# Patient Record
Sex: Female | Born: 1965 | Race: White | Hispanic: No | Marital: Married | Smoking: Never smoker
Health system: Southern US, Community
[De-identification: ages and names within clinical notes are randomized; demographics above are authoritative.]

## PROBLEM LIST (undated history)

## (undated) DIAGNOSIS — Z975 Presence of (intrauterine) contraceptive device: Secondary | ICD-10-CM

## (undated) DIAGNOSIS — M1990 Unspecified osteoarthritis, unspecified site: Secondary | ICD-10-CM

## (undated) HISTORY — PX: COLONOSCOPY: SHX174

## (undated) HISTORY — PX: POLYPECTOMY: SHX149

## (undated) HISTORY — DX: Presence of (intrauterine) contraceptive device: Z97.5

## (undated) HISTORY — PX: CHEST TUBE INSERTION: SHX231

## (undated) HISTORY — DX: Unspecified osteoarthritis, unspecified site: M19.90

---

## 1999-05-09 ENCOUNTER — Encounter: Payer: Self-pay | Admitting: Gynecology

## 1999-05-09 ENCOUNTER — Encounter: Admission: RE | Admit: 1999-05-09 | Discharge: 1999-05-09 | Payer: Self-pay | Admitting: Gynecology

## 2000-06-01 ENCOUNTER — Other Ambulatory Visit: Admission: RE | Admit: 2000-06-01 | Discharge: 2000-06-01 | Payer: Self-pay | Admitting: Gynecology

## 2001-08-01 ENCOUNTER — Other Ambulatory Visit: Admission: RE | Admit: 2001-08-01 | Discharge: 2001-08-01 | Payer: Self-pay | Admitting: Gynecology

## 2002-08-03 ENCOUNTER — Other Ambulatory Visit: Admission: RE | Admit: 2002-08-03 | Discharge: 2002-08-03 | Payer: Self-pay | Admitting: Gynecology

## 2003-08-21 ENCOUNTER — Other Ambulatory Visit: Admission: RE | Admit: 2003-08-21 | Discharge: 2003-08-21 | Payer: Self-pay | Admitting: Gynecology

## 2004-08-22 ENCOUNTER — Other Ambulatory Visit: Admission: RE | Admit: 2004-08-22 | Discharge: 2004-08-22 | Payer: Self-pay | Admitting: Gynecology

## 2004-09-19 ENCOUNTER — Encounter: Admission: RE | Admit: 2004-09-19 | Discharge: 2004-09-19 | Payer: Self-pay | Admitting: Gynecology

## 2005-08-25 ENCOUNTER — Other Ambulatory Visit: Admission: RE | Admit: 2005-08-25 | Discharge: 2005-08-25 | Payer: Self-pay | Admitting: Gynecology

## 2005-11-25 ENCOUNTER — Encounter: Admission: RE | Admit: 2005-11-25 | Discharge: 2005-11-25 | Payer: Self-pay | Admitting: Gynecology

## 2006-09-13 ENCOUNTER — Other Ambulatory Visit: Admission: RE | Admit: 2006-09-13 | Discharge: 2006-09-13 | Payer: Self-pay | Admitting: Gynecology

## 2007-01-04 ENCOUNTER — Encounter: Admission: RE | Admit: 2007-01-04 | Discharge: 2007-01-04 | Payer: Self-pay | Admitting: Gynecology

## 2007-05-09 ENCOUNTER — Encounter: Admission: RE | Admit: 2007-05-09 | Discharge: 2007-05-09 | Payer: Self-pay | Admitting: Orthopedic Surgery

## 2007-05-24 ENCOUNTER — Encounter: Admission: RE | Admit: 2007-05-24 | Discharge: 2007-05-24 | Payer: Self-pay | Admitting: Orthopedic Surgery

## 2008-01-05 ENCOUNTER — Encounter: Admission: RE | Admit: 2008-01-05 | Discharge: 2008-01-05 | Payer: Self-pay | Admitting: Gynecology

## 2009-04-25 ENCOUNTER — Encounter: Admission: RE | Admit: 2009-04-25 | Discharge: 2009-04-25 | Payer: Self-pay | Admitting: Gynecology

## 2009-12-20 ENCOUNTER — Encounter (INDEPENDENT_AMBULATORY_CARE_PROVIDER_SITE_OTHER): Payer: Self-pay | Admitting: *Deleted

## 2010-01-01 ENCOUNTER — Ambulatory Visit: Payer: Self-pay | Admitting: Family Medicine

## 2010-01-01 DIAGNOSIS — H5310 Unspecified subjective visual disturbances: Secondary | ICD-10-CM | POA: Insufficient documentation

## 2010-01-01 DIAGNOSIS — J329 Chronic sinusitis, unspecified: Secondary | ICD-10-CM | POA: Insufficient documentation

## 2010-01-01 DIAGNOSIS — L851 Acquired keratosis [keratoderma] palmaris et plantaris: Secondary | ICD-10-CM | POA: Insufficient documentation

## 2010-01-13 ENCOUNTER — Encounter (INDEPENDENT_AMBULATORY_CARE_PROVIDER_SITE_OTHER): Payer: Self-pay | Admitting: *Deleted

## 2010-01-13 ENCOUNTER — Encounter: Payer: Self-pay | Admitting: Family Medicine

## 2010-01-13 LAB — CONVERTED CEMR LAB
Albumin: 4.3 g/dL
Alkaline Phosphatase: 57 units/L
Creatinine, Ser: 0.72 mg/dL
LDL Cholesterol: 123 mg/dL
MCH: 29.9 pg
MCV: 87.5 fL
Potassium, serum: 3.7 mmol/L
RBC count: 4.81 10*6/uL
Total Protein: 6.7 g/dL
Triglycerides: 102 mg/dL

## 2010-01-27 ENCOUNTER — Encounter (INDEPENDENT_AMBULATORY_CARE_PROVIDER_SITE_OTHER): Payer: Self-pay | Admitting: *Deleted

## 2010-04-29 ENCOUNTER — Encounter
Admission: RE | Admit: 2010-04-29 | Discharge: 2010-04-29 | Payer: Self-pay | Source: Home / Self Care | Attending: Gynecology | Admitting: Gynecology

## 2010-05-13 NOTE — Miscellaneous (Signed)
  Clinical Lists Changes  Observations: Added new observation of TSH: 1.204 microintl units/mL (01/13/2010 16:53) Added new observation of TRIGLYC TOT: 102 mg/dL (16/01/9603 54:09) Added new observation of LDL: 123 mg/dL (81/19/1478 29:56) Added new observation of HDL: 78 mg/dL (21/30/8657 84:69) Added new observation of CHOLESTEROL: 224 mg/dL (62/95/2841 32:44) Added new observation of BILI TOTAL: 1.3 mg/dL (04/15/7251 66:44) Added new observation of ALK PHOS: 57 units/L (01/13/2010 16:53) Added new observation of SGPT (ALT): 13 units/L (01/13/2010 16:53) Added new observation of SGOT (AST): 18 units/L (01/13/2010 16:53) Added new observation of PROTEIN, TOT: 6.7 g/dL (03/47/4259 56:38) Added new observation of ALBUMIN: 4.3 g/dL (75/64/3329 51:88) Added new observation of CALCIUM: 8.7 mg/dL (41/66/0630 16:01) Added new observation of GLUCOSE SER: 94 mg/dL (09/32/3557 32:20) Added new observation of CREATININE: 0.72 mg/dL (25/42/7062 37:62) Added new observation of BUN: 10 mg/dL (83/15/1761 60:73) Added new observation of CO2 TOTAL: 22 mmol/L (01/13/2010 16:53) Added new observation of CHLORIDE: 105 mmol/L (01/13/2010 16:53) Added new observation of POTASSIUM: 3.7 mmol/L (01/13/2010 16:53) Added new observation of SODIUM: 138 mmol/L (01/13/2010 16:53) Added new observation of PLATELETS: 285 10*3/mm3 (01/13/2010 16:53) Added new observation of MCH: 29.9 pg (01/13/2010 16:53) Added new observation of MCV: 87.5 fL (01/13/2010 16:53) Added new observation of HCT: 42.1 % (01/13/2010 16:53) Added new observation of HGB: 14.4 g/dL (71/09/2692 85:46) Added new observation of RBC: 4.81 10*6/mm3 (01/13/2010 16:53) Added new observation of WBC: 5.3 10*3/mm3 (01/13/2010 16:53)

## 2010-05-13 NOTE — Miscellaneous (Signed)
  Clinical Lists Changes  Observations: Added new observation of TD BOOSTER: Historical (10/20/2004 9:44)      Immunization History:  Tetanus/Td Immunization History:    Tetanus/Td:  historical (10/20/2004)

## 2010-05-13 NOTE — Letter (Signed)
Summary: Beather Arbour MD  Beather Arbour MD   Imported By: Lanelle Bal 02/04/2010 11:44:17  _____________________________________________________________________  External Attachment:    Type:   Image     Comment:   External Document
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
## 2011-03-27 ENCOUNTER — Telehealth: Payer: Self-pay | Admitting: *Deleted

## 2011-03-27 NOTE — Telephone Encounter (Signed)
Spoke with patient's husband and advised results. And to have pt call back in to schedule a CPE per has not been seen in over a year

## 2011-05-07 ENCOUNTER — Telehealth: Payer: Self-pay | Admitting: *Deleted

## 2011-05-07 NOTE — Telephone Encounter (Signed)
Noted pt has not been seen in over a year, pt husband was spoken to on 03-27-11 to have pt call in to schedule a CPE, per noted [facility] lab report, sent a letter to pt to advise she needs to schedule an upcoming CPE

## 2011-05-19 ENCOUNTER — Other Ambulatory Visit: Payer: Self-pay | Admitting: Gynecology

## 2011-05-19 DIAGNOSIS — Z1231 Encounter for screening mammogram for malignant neoplasm of breast: Secondary | ICD-10-CM

## 2011-05-27 ENCOUNTER — Ambulatory Visit
Admission: RE | Admit: 2011-05-27 | Discharge: 2011-05-27 | Disposition: A | Discharge status: Home / Self Care | Payer: BC Managed Care – PPO | Source: Ambulatory Visit | Attending: Gynecology | Admitting: Gynecology

## 2011-05-27 DIAGNOSIS — Z1231 Encounter for screening mammogram for malignant neoplasm of breast: Secondary | ICD-10-CM

## 2012-05-16 ENCOUNTER — Encounter: Payer: Self-pay | Admitting: Family Medicine

## 2012-05-16 ENCOUNTER — Ambulatory Visit (INDEPENDENT_AMBULATORY_CARE_PROVIDER_SITE_OTHER): Payer: BC Managed Care – PPO | Admitting: Family Medicine

## 2012-05-16 VITALS — BP 108/70 | HR 76 | Temp 98.0°F | Ht 67.5 in | Wt 167.4 lb

## 2012-05-16 DIAGNOSIS — E785 Hyperlipidemia, unspecified: Secondary | ICD-10-CM

## 2012-05-16 DIAGNOSIS — M79673 Pain in unspecified foot: Secondary | ICD-10-CM

## 2012-05-16 DIAGNOSIS — M79609 Pain in unspecified limb: Secondary | ICD-10-CM

## 2012-05-16 NOTE — Progress Notes (Signed)
  Subjective:    Patient ID: Briana Holden, female    DOB: [birth year], 47 y.o.   MRN: [id number]  HPI Hyperlipidemia- pt had labs done at GYN and was found to high elevated cholesterol.  Cholesterol from 11/12 showed total of 233 and LDL of 143.  No family hx of CAD, CVA.  Labs from 11/13 showed mild improvement in both LDL and total cholesterol.  Pt not interested in cholesterol med 'unless i have to'.  Admits to limited activity.  Foot pain- under balls of feet bilaterally.  Saw sports med and they fit her w/ metatarsal pads.  This made pain worse than before.  After 2 months of metatarsal pads, removed temporary orthotics.  Pain in L foot has dramatically improved and is starting to improve on R.  Wants to know what she can do or what are the next steps.   Review of Systems For ROS see HPI     Objective:   Physical Exam  Vitals reviewed. Constitutional: She is oriented to person, place, and time. She appears well-developed and well-nourished. No distress.  HENT:  Head: Normocephalic and atraumatic.  Eyes: Conjunctivae normal and EOM are normal. Pupils are equal, round, and reactive to light.  Neck: Normal range of motion. Neck supple. No thyromegaly present.  Cardiovascular: Normal rate, regular rhythm, normal heart sounds and intact distal pulses.   No murmur heard. Pulmonary/Chest: Effort normal and breath sounds normal. No respiratory distress.  Abdominal: Soft. She exhibits no distension. There is no tenderness.  Musculoskeletal: She exhibits no edema.  Lymphadenopathy:    She has no cervical adenopathy.  Neurological: She is alert and oriented to person, place, and time.  Skin: Skin is warm and dry.  Psychiatric: She has a normal mood and affect. Her behavior is normal.          Assessment & Plan:

## 2012-05-16 NOTE — Patient Instructions (Addendum)
Follow up in 6 months to recheck cholesterol Work on healthy diet and regular exercise You can add OTC Red Yeast Rice if so inclined to lower LDL Ice your foot by rolling it over the water bottle- goal is 1-2x/day Wear good, cushioned/supportive shoes If no improvement in 2 weeks- call and we'll get you set up w/ Podiatry Call with any questions or concerns Happy [birth year] Birthday!

## 2012-05-17 DIAGNOSIS — E785 Hyperlipidemia, unspecified: Secondary | ICD-10-CM | POA: Insufficient documentation

## 2012-05-17 DIAGNOSIS — M79673 Pain in unspecified foot: Secondary | ICD-10-CM | POA: Insufficient documentation

## 2012-05-17 NOTE — Assessment & Plan Note (Signed)
New.  Pt's LDL and total cholesterol are not at goal but not outrageously high.  Reviewed increased risk of MI, CVA.  Pt prefers to hold off on meds and work on healthy diet and regular exercise.  Will repeat labs in 6 months.

## 2012-05-17 NOTE — Assessment & Plan Note (Signed)
New.  Pt w/ dx of metatarsalgia.  Pain improving since removal of metatarsal pads.  Encouraged ice, stretching, and supportive shoes.  If no improvement, will refer to podiatry.  Pt expressed understanding and is in agreement w/ plan.

## 2012-06-10 ENCOUNTER — Telehealth: Payer: Self-pay | Admitting: Family Medicine

## 2012-06-10 DIAGNOSIS — M79673 Pain in unspecified foot: Secondary | ICD-10-CM

## 2012-06-10 NOTE — Telephone Encounter (Signed)
Ok to enter referral 

## 2012-06-10 NOTE — Telephone Encounter (Signed)
Patient states she is still having problems with her foot. She would like to pursue referral to podiatrist. She cannot go next week or on 3/10.

## 2012-06-12 NOTE — Telephone Encounter (Signed)
Ok for podiatry referral- dx foot pain

## 2012-06-14 NOTE — Telephone Encounter (Signed)
Referral for podiatry sent.//AB/CMA

## 2012-07-13 ENCOUNTER — Other Ambulatory Visit: Payer: Self-pay

## 2012-07-13 DIAGNOSIS — Z1231 Encounter for screening mammogram for malignant neoplasm of breast: Secondary | ICD-10-CM

## 2012-07-20 ENCOUNTER — Ambulatory Visit
Admission: RE | Admit: 2012-07-20 | Discharge: 2012-07-20 | Disposition: A | Discharge status: Home / Self Care | Payer: BC Managed Care – PPO | Source: Ambulatory Visit

## 2012-07-20 DIAGNOSIS — Z1231 Encounter for screening mammogram for malignant neoplasm of breast: Secondary | ICD-10-CM

## 2012-11-14 ENCOUNTER — Other Ambulatory Visit: Payer: BC Managed Care – PPO

## 2012-12-09 ENCOUNTER — Other Ambulatory Visit (INDEPENDENT_AMBULATORY_CARE_PROVIDER_SITE_OTHER): Payer: BC Managed Care – PPO

## 2012-12-09 DIAGNOSIS — E785 Hyperlipidemia, unspecified: Secondary | ICD-10-CM

## 2012-12-09 LAB — LIPID PANEL
Cholesterol: 219 mg/dL — ABNORMAL HIGH (ref 0–200)
Total CHOL/HDL Ratio: 3
Triglycerides: 79 mg/dL (ref 0.0–149.0)

## 2013-02-02 ENCOUNTER — Encounter: Payer: Self-pay | Admitting: Internal Medicine

## 2013-02-02 ENCOUNTER — Ambulatory Visit (INDEPENDENT_AMBULATORY_CARE_PROVIDER_SITE_OTHER): Payer: BC Managed Care – PPO | Admitting: Internal Medicine

## 2013-02-02 VITALS — BP 110/74 | HR 78 | Temp 98.8°F | Wt 171.0 lb

## 2013-02-02 DIAGNOSIS — N3090 Cystitis, unspecified without hematuria: Secondary | ICD-10-CM

## 2013-02-02 LAB — URINALYSIS, ROUTINE W REFLEX MICROSCOPIC
Ketones, ur: NEGATIVE
Specific Gravity, Urine: 1.02 (ref 1.000–1.030)
Total Protein, Urine: NEGATIVE
Urine Glucose: NEGATIVE
pH: 7 (ref 5.0–8.0)

## 2013-02-02 MED ORDER — CIPROFLOXACIN HCL 500 MG PO TABS: 500.0000 mg | ORAL_TABLET | Freq: Two times a day (BID) | ORAL | Status: DC

## 2013-02-02 MED ORDER — PHENAZOPYRIDINE HCL 200 MG PO TABS: 200.0000 mg | ORAL_TABLET | Freq: Three times a day (TID) | ORAL | Status: DC | PRN

## 2013-02-02 NOTE — Patient Instructions (Addendum)
cipro for 3 days Phenazopyridine as needed Drink plenty of fluids Call if not back to normal in few days, call anytime if severe symptoms

## 2013-02-02 NOTE — Progress Notes (Signed)
  Subjective:    Patient ID: Briana Holden, female    DOB: [birth year], 47 y.o.   MRN: [id number]  HPI Acute visit One-day history of urinary frequency with small amount of urine production, mild irritation in the bladder without dysuria per se. No recent UTIs. Patient drinks plenty of fluids.  No past medical history on file. Past Surgical History  Procedure Laterality Date  . Chest tube insertion      age 40   History   Social History  . Marital Status: Married    Spouse Name: N/A    Number of Children: 1  . Years of Education: N/A   Occupational History  . full time job    Social History Main Topics  . Smoking status: Never Smoker   . Smokeless tobacco: Never Used  . Alcohol Use: Yes     Comment: 1 drink per week  . Drug Use: No  . Sexual Activity: Not on file   Other Topics Concern  . Not on file   Social History Narrative  . No narrative on file    Review of Systems No fever chills, no flank pain. No vaginal discharge, itching or rash. Birth  Control-- IUD    Objective:   Physical Exam BP 110/74  Pulse 78  Temp(Src) 98.8 F (37.1 C)  Wt 171 lb (77.565 kg)  BMI 26.37 kg/m2  SpO2 97% General -- alert, well-developed, NAD.  Abdomen-- Not distended, good bowel sounds,soft, non-tender but felt some depression upon palpation of the lower abdomen. No rebound or rigidity. No CVA tenderness. Extremities-- no pretibial edema bilaterally  Neurologic--  alert & oriented X3. Speech normal, gait normal, strength normal in all extremities.  DTRs symmetric  Psych-- Cognition and judgment appear intact. Cooperative with normal attention span and concentration. No anxious appearing , no depressed appearing.      Assessment & Plan:  Symptoms consistent with cystitis, Send UA and urine culture, start empiric Cipro. See instructions. To use phenazopyridine if needed

## 2013-02-03 LAB — URINE CULTURE
Colony Count: NO GROWTH
Organism ID, Bacteria: NO GROWTH

## 2013-03-02 ENCOUNTER — Ambulatory Visit (INDEPENDENT_AMBULATORY_CARE_PROVIDER_SITE_OTHER): Payer: BC Managed Care – PPO

## 2013-03-02 ENCOUNTER — Encounter: Payer: Self-pay | Admitting: Podiatry

## 2013-03-02 ENCOUNTER — Ambulatory Visit (INDEPENDENT_AMBULATORY_CARE_PROVIDER_SITE_OTHER): Payer: BC Managed Care – PPO | Admitting: Podiatry

## 2013-03-02 VITALS — BP 111/82 | HR 80 | Resp 12 | Ht 68.0 in | Wt 160.0 lb

## 2013-03-02 DIAGNOSIS — R52 Pain, unspecified: Secondary | ICD-10-CM

## 2013-03-02 DIAGNOSIS — M779 Enthesopathy, unspecified: Secondary | ICD-10-CM

## 2013-03-02 NOTE — Progress Notes (Signed)
N- SORE L-B/L FOOT D- OVER A YEAR O-SLOWLY C-BETTER A-WALKING T-BETTER QUALITY TENNIS SHOES

## 2013-03-02 NOTE — Progress Notes (Signed)
Subjective:     Patient ID: Briana Holden, female   DOB: 02/21/1966, 47 y.o.   MRN: [id number]  Foot Pain   patient presents stating I've been having trouble with my forefeet both feet for the last 2 years since and overuse injury occurred. I have been to several doctors with  current relief of my symptoms. Patient has tried orthotics and padding without relief   Review of Systems  All other systems reviewed and are negative.       Objective:   Physical Exam  Nursing note and vitals reviewed. Constitutional: She is oriented to person, place, and time.  Cardiovascular: Intact distal pulses.   Musculoskeletal: Normal range of motion.  Neurological: She is oriented to person, place, and time.  Skin: Skin is warm.   patient is found to have mild discomfort underneath the second metatarsal phalangeal joint both feet with swelling present. She states as long as she wears her tennis shoes she's pretty good but if she tries to wear other shoes they flare up. 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 She is somewhat weary of this so I tried to educate her to the benefits but ultimately she has to make the choice to whether she wants to do this or not. Reappoint if she decides for injection treatment

## 2013-06-21 ENCOUNTER — Telehealth: Payer: Self-pay | Admitting: *Deleted

## 2013-06-21 NOTE — Telephone Encounter (Signed)
Symptoms:  Sinus pain and pressure, nasal congestion, sneezing, and runny eyes.    Has been taking Sudafed (since yesterday morning), Affin, and ibuprofen, and nasal irrigation with very minimal relief.    Per patient typically gets a sinus infection after a cold and has hx. recurrent sinusitis.      Please advise.

## 2013-06-21 NOTE — Telephone Encounter (Signed)
Patient called and stated that she is having symptoms of a sinus infection. She states she does not want to be seen and would prefer something be called in for her. Please advise.JG//CMA

## 2013-06-21 NOTE — Telephone Encounter (Signed)
Add OTC Claritin or Zyrtec, Nasacort/Flonase.  Nyquil or Dayquil as needed.  If no improvement will need appt.  No abx w/o appt

## 2013-06-21 NOTE — Telephone Encounter (Signed)
Patient was advised the same.  Patient stated understanding.  No further concerns voiced.

## 2013-06-22 ENCOUNTER — Encounter: Payer: Self-pay | Admitting: Family Medicine

## 2013-06-22 ENCOUNTER — Ambulatory Visit (INDEPENDENT_AMBULATORY_CARE_PROVIDER_SITE_OTHER): Payer: BC Managed Care – PPO | Admitting: Family Medicine

## 2013-06-22 VITALS — BP 104/76 | HR 87 | Temp 98.4°F | Resp 16 | Wt 169.2 lb

## 2013-06-22 DIAGNOSIS — J329 Chronic sinusitis, unspecified: Secondary | ICD-10-CM

## 2013-06-22 MED ORDER — AMOXICILLIN 875 MG PO TABS: 875.0000 mg | ORAL_TABLET | Freq: Two times a day (BID) | ORAL | Status: DC

## 2013-06-22 MED ORDER — SULFAMETHOXAZOLE-TMP DS 800-160 MG PO TABS: 1.0000 | ORAL_TABLET | Freq: Two times a day (BID) | ORAL | Status: DC

## 2013-06-22 NOTE — Patient Instructions (Signed)
Follow up as needed Bactrim twice daily- take w/ food Drink plenty of fluids REST! Start daily antihistamine to prevent congestion and subsequent infection Call with any questions or concerns Hang in there!

## 2013-06-22 NOTE — Progress Notes (Signed)
Pre visit review using our clinic review tool, if applicable. No additional management support is needed unless otherwise documented below in the visit note. 

## 2013-06-22 NOTE — Assessment & Plan Note (Signed)
Pt's sxs and PE consistent w/ infxn.  Start abx.  Pt claims Amox is ineffective.  Start Bactrim.  Reviewed supportive care and red flags that should prompt return.  Pt expressed understanding and is in agreement w/ plan.

## 2013-06-22 NOTE — Progress Notes (Signed)
   Subjective:    Patient ID: Briana Holden, female    DOB: Jan 25, 1966, 48 y.o.   MRN: [id number]  Sinusitis   URI- sxs initially started 2 weeks ago as 'low level sinus thing'.  Starting Monday developed increased congestion, pressure, swelling, sneezing.  No fevers.  + sick contacts.  No N/V.  + tooth pain.  Bilateral ear fullness.   Review of Systems For ROS see HPI     Objective:   Physical Exam  Vitals reviewed. Constitutional: She appears well-developed and well-nourished. No distress.  HENT:  Head: Normocephalic and atraumatic.  Right Ear: Tympanic membrane normal.  Left Ear: Tympanic membrane normal.  Nose: Mucosal edema and rhinorrhea present. Right sinus exhibits maxillary sinus tenderness and frontal sinus tenderness. Left sinus exhibits maxillary sinus tenderness and frontal sinus tenderness.  Mouth/Throat: Uvula is midline and mucous membranes are normal. Posterior oropharyngeal erythema present. No oropharyngeal exudate.  Eyes: Conjunctivae and EOM are normal. Pupils are equal, round, and reactive to light.  Neck: Normal range of motion. Neck supple.  Cardiovascular: Normal rate, regular rhythm and normal heart sounds.   Pulmonary/Chest: Effort normal and breath sounds normal. No respiratory distress. She has no wheezes.  Lymphadenopathy:    She has no cervical adenopathy.          Assessment & Plan:

## 2013-06-29 ENCOUNTER — Other Ambulatory Visit: Payer: Self-pay

## 2013-06-29 DIAGNOSIS — Z1231 Encounter for screening mammogram for malignant neoplasm of breast: Secondary | ICD-10-CM

## 2013-07-24 ENCOUNTER — Ambulatory Visit
Admission: RE | Admit: 2013-07-24 | Discharge: 2013-07-24 | Disposition: A | Discharge status: Home / Self Care | Payer: BC Managed Care – PPO | Source: Ambulatory Visit

## 2013-07-24 DIAGNOSIS — Z1231 Encounter for screening mammogram for malignant neoplasm of breast: Secondary | ICD-10-CM

## 2013-07-26 ENCOUNTER — Encounter: Payer: Self-pay | Admitting: General Practice

## 2013-07-26 ENCOUNTER — Other Ambulatory Visit (INDEPENDENT_AMBULATORY_CARE_PROVIDER_SITE_OTHER): Payer: BC Managed Care – PPO

## 2013-07-26 ENCOUNTER — Other Ambulatory Visit: Payer: Self-pay | Admitting: Family Medicine

## 2013-07-26 ENCOUNTER — Other Ambulatory Visit: Payer: BC Managed Care – PPO

## 2013-07-26 DIAGNOSIS — Z1211 Encounter for screening for malignant neoplasm of colon: Secondary | ICD-10-CM

## 2013-07-26 LAB — FECAL OCCULT BLOOD, IMMUNOCHEMICAL: FECAL OCCULT BLD: NEGATIVE

## 2013-07-28 ENCOUNTER — Encounter: Payer: BC Managed Care – PPO | Admitting: Family Medicine

## 2013-08-10 ENCOUNTER — Telehealth: Payer: Self-pay

## 2013-08-10 NOTE — Telephone Encounter (Signed)
Left message for call back Non-identifiable   Pap [facility]- 07/24/13-negative BD Flu Td- 10/20/04

## 2013-08-11 ENCOUNTER — Encounter: Payer: Self-pay | Admitting: Family Medicine

## 2013-08-11 ENCOUNTER — Ambulatory Visit (INDEPENDENT_AMBULATORY_CARE_PROVIDER_SITE_OTHER): Payer: BC Managed Care – PPO | Admitting: Family Medicine

## 2013-08-11 ENCOUNTER — Other Ambulatory Visit (HOSPITAL_COMMUNITY)
Admission: RE | Admit: 2013-08-11 | Discharge: 2013-08-11 | Disposition: A | Discharge status: Home / Self Care | Payer: BC Managed Care – PPO | Source: Ambulatory Visit | Attending: Family Medicine | Admitting: Family Medicine

## 2013-08-11 VITALS — BP 100/72 | HR 81 | Temp 98.2°F | Resp 16 | Ht 69.0 in | Wt 168.2 lb

## 2013-08-11 DIAGNOSIS — Z0000 Encounter for general adult medical examination without abnormal findings: Secondary | ICD-10-CM | POA: Insufficient documentation

## 2013-08-11 DIAGNOSIS — Z01419 Encounter for gynecological examination (general) (routine) without abnormal findings: Secondary | ICD-10-CM

## 2013-08-11 DIAGNOSIS — Z124 Encounter for screening for malignant neoplasm of cervix: Secondary | ICD-10-CM | POA: Insufficient documentation

## 2013-08-11 DIAGNOSIS — Z1151 Encounter for screening for human papillomavirus (HPV): Secondary | ICD-10-CM | POA: Insufficient documentation

## 2013-08-11 LAB — BASIC METABOLIC PANEL
BUN: 10 mg/dL (ref 6–23)
CALCIUM: 8.9 mg/dL (ref 8.4–10.5)
CO2: 28 mEq/L (ref 19–32)
CREATININE: 0.7 mg/dL (ref 0.4–1.2)
Chloride: 102 mEq/L (ref 96–112)
GFR: 101.06 mL/min (ref >60.00)
GLUCOSE: 91 mg/dL (ref 70–99)
POTASSIUM: 3.9 meq/L (ref 3.5–5.1)
Sodium: 137 mEq/L (ref 135–145)

## 2013-08-11 LAB — HEPATIC FUNCTION PANEL
ALBUMIN: 4.1 g/dL (ref 3.5–5.2)
ALK PHOS: 59 U/L (ref 39–117)
ALT: 16 U/L (ref 0–35)
AST: 19 U/L (ref 0–37)
Bilirubin, Direct: 0 mg/dL (ref 0.0–0.3)
TOTAL PROTEIN: 6.9 g/dL (ref 6.0–8.3)
Total Bilirubin: 1.1 mg/dL (ref 0.3–1.2)

## 2013-08-11 LAB — CBC WITH DIFFERENTIAL/PLATELET
BASOS ABS: 0 10*3/uL (ref 0.0–0.1)
Basophils Relative: 0.4 % (ref 0.0–3.0)
EOS PCT: 2.5 % (ref 0.0–5.0)
Eosinophils Absolute: 0.2 10*3/uL (ref 0.0–0.7)
HCT: 42.8 % (ref 36.0–46.0)
HEMOGLOBIN: 14.6 g/dL (ref 12.0–15.0)
LYMPHS PCT: 26.6 % (ref 12.0–46.0)
Lymphs Abs: 1.7 10*3/uL (ref 0.7–4.0)
MCHC: 34.1 g/dL (ref 30.0–36.0)
MCV: 89.7 fl (ref 78.0–100.0)
MONOS PCT: 9.6 % (ref 3.0–12.0)
Monocytes Absolute: 0.6 10*3/uL (ref 0.1–1.0)
NEUTROS ABS: 4 10*3/uL (ref 1.4–7.7)
Neutrophils Relative %: 60.9 % (ref 43.0–77.0)
PLATELETS: 281 10*3/uL (ref 150.0–400.0)
RBC: 4.77 Mil/uL (ref 3.87–5.11)
RDW: 13.2 % (ref 11.5–14.6)
WBC: 6.5 10*3/uL (ref 4.5–10.5)

## 2013-08-11 LAB — LIPID PANEL
CHOLESTEROL: 219 mg/dL — AB (ref 0–200)
HDL: 71.4 mg/dL (ref >39.00)
LDL CALC: 132 mg/dL — AB (ref 0–99)
TRIGLYCERIDES: 76 mg/dL (ref 0.0–149.0)
Total CHOL/HDL Ratio: 3
VLDL: 15.2 mg/dL (ref 0.0–40.0)

## 2013-08-11 LAB — TSH: TSH: 0.75 u[IU]/mL (ref 0.35–5.50)

## 2013-08-11 NOTE — Assessment & Plan Note (Signed)
Pt's PE WNL.  UTD on mammo.  Pap collected.  Check labs.  Anticipatory guidance provided.

## 2013-08-11 NOTE — Assessment & Plan Note (Signed)
Pap collected. 

## 2013-08-11 NOTE — Progress Notes (Signed)
Pre visit review using our clinic review tool, if applicable. No additional management support is needed unless otherwise documented below in the visit note. 

## 2013-08-11 NOTE — Patient Instructions (Signed)
Follow up in 1 year or as needed We'll notify you of your lab results and make any changes if needed Call with any questions or concerns Keep up the good work!  You look great! Happy Spring!

## 2013-08-11 NOTE — Progress Notes (Signed)
   Subjective:    Patient ID: Briana Holden, female    DOB: Aug 16, 1965, 48 y.o.   MRN: [id number]  HPI CPE- pt thinks pap was 2 yrs ago.  UTD on mammo.  IUD placed Jan 2013.  No concerns today.   Review of Systems Patient reports no vision/ hearing changes, adenopathy,fever, weight change,  persistant/recurrent hoarseness , swallowing issues, chest pain, palpitations, edema, persistant/recurrent cough, hemoptysis, dyspnea (rest/exertional/paroxysmal nocturnal), gastrointestinal bleeding (melena, rectal bleeding), abdominal pain, significant heartburn, bowel changes, GU symptoms (dysuria, hematuria, incontinence), Gyn symptoms (abnormal  bleeding, pain),  syncope, focal weakness, memory loss, numbness & tingling, skin/hair/nail changes, abnormal bruising or bleeding, anxiety, or depression.     Objective:   Physical Exam  General Appearance:    Alert, cooperative, no distress, appears stated age  Head:    Normocephalic, without obvious abnormality, atraumatic  Eyes:    PERRL, conjunctiva/corneas clear, EOM's intact, fundi    benign, both eyes  Ears:    Normal TM's and external ear canals, both ears  Nose:   Nares normal, septum midline, mucosa normal, no drainage    or sinus tenderness  Throat:   Lips, mucosa, and tongue normal; teeth and gums normal  Neck:   Supple, symmetrical, trachea midline, no adenopathy;    Thyroid: no enlargement/tenderness/nodules  Back:     Symmetric, no curvature, ROM normal, no CVA tenderness  Lungs:     Clear to auscultation bilaterally, respirations unlabored  Chest Wall:    No tenderness or deformity   Heart:    Regular rate and rhythm, S1 and S2 normal, no murmur, rub   or gallop  Breast Exam:    No tenderness, masses, or nipple abnormality  Abdomen:     Soft, non-tender, bowel sounds active all four quadrants,    no masses, no organomegaly  Genitalia:    External genitalia normal, cervix normal in appearance, no CMT, uterus in normal size and  position, adnexa w/out mass or tenderness, mucosa pink and moist, no lesions or discharge present  Rectal:    Normal external appearance  Extremities:   Extremities normal, atraumatic, no cyanosis or edema  Pulses:   2+ and symmetric all extremities  Skin:   Skin color, texture, turgor normal, no rashes or lesions  Lymph nodes:   Cervical, supraclavicular, and axillary nodes normal  Neurologic:   CNII-XII intact, normal strength, sensation and reflexes    throughout          Assessment & Plan:

## 2013-08-14 NOTE — Telephone Encounter (Signed)
Unable to reach pre visit.  

## 2013-08-16 ENCOUNTER — Encounter: Payer: Self-pay | Admitting: General Practice

## 2013-08-16 LAB — VITAMIN D 1,25 DIHYDROXY
VITAMIN D 1, 25 (OH) TOTAL: 52 pg/mL (ref 18–72)
Vitamin D3 1, 25 (OH)2: 52 pg/mL

## 2014-06-19 ENCOUNTER — Other Ambulatory Visit: Payer: Self-pay

## 2014-06-19 DIAGNOSIS — Z1231 Encounter for screening mammogram for malignant neoplasm of breast: Secondary | ICD-10-CM

## 2014-07-26 ENCOUNTER — Ambulatory Visit
Admission: RE | Admit: 2014-07-26 | Discharge: 2014-07-26 | Disposition: A | Discharge status: Home / Self Care | Payer: BLUE CROSS/BLUE SHIELD | Source: Ambulatory Visit

## 2014-07-26 DIAGNOSIS — Z1231 Encounter for screening mammogram for malignant neoplasm of breast: Secondary | ICD-10-CM

## 2014-07-27 ENCOUNTER — Telehealth: Payer: Self-pay | Admitting: Family Medicine

## 2014-07-27 NOTE — Telephone Encounter (Signed)
Pre Visit letter sent  °

## 2014-08-14 ENCOUNTER — Encounter: Payer: Self-pay | Admitting: *Deleted

## 2014-08-14 ENCOUNTER — Telehealth: Payer: Self-pay | Admitting: *Deleted

## 2014-08-14 NOTE — Telephone Encounter (Signed)
Pre-Visit Call completed with patient and chart updated.   Pre-Visit Info documented in Specialty Comments under [employer].    

## 2014-08-15 ENCOUNTER — Other Ambulatory Visit: Payer: Self-pay | Admitting: General Practice

## 2014-08-15 ENCOUNTER — Ambulatory Visit (INDEPENDENT_AMBULATORY_CARE_PROVIDER_SITE_OTHER): Payer: BLUE CROSS/BLUE SHIELD | Admitting: Family Medicine

## 2014-08-15 ENCOUNTER — Encounter: Payer: Self-pay | Admitting: Family Medicine

## 2014-08-15 VITALS — BP 102/70 | HR 74 | Temp 98.3°F | Resp 16 | Ht 69.0 in | Wt 168.1 lb

## 2014-08-15 DIAGNOSIS — Z0000 Encounter for general adult medical examination without abnormal findings: Secondary | ICD-10-CM

## 2014-08-15 DIAGNOSIS — Z23 Encounter for immunization: Secondary | ICD-10-CM | POA: Diagnosis not present

## 2014-08-15 DIAGNOSIS — Z1211 Encounter for screening for malignant neoplasm of colon: Secondary | ICD-10-CM

## 2014-08-15 DIAGNOSIS — M7989 Other specified soft tissue disorders: Secondary | ICD-10-CM

## 2014-08-15 LAB — CBC WITH DIFFERENTIAL/PLATELET
BASOS ABS: 0 10*3/uL (ref 0.0–0.1)
BASOS PCT: 0.5 % (ref 0.0–3.0)
Eosinophils Absolute: 0.1 10*3/uL (ref 0.0–0.7)
Eosinophils Relative: 2.6 % (ref 0.0–5.0)
HCT: 41.3 % (ref 36.0–46.0)
HEMOGLOBIN: 14.1 g/dL (ref 12.0–15.0)
LYMPHS ABS: 1.4 10*3/uL (ref 0.7–4.0)
Lymphocytes Relative: 28.9 % (ref 12.0–46.0)
MCHC: 34.2 g/dL (ref 30.0–36.0)
MCV: 87.7 fl (ref 78.0–100.0)
MONOS PCT: 9.1 % (ref 3.0–12.0)
Monocytes Absolute: 0.5 10*3/uL (ref 0.1–1.0)
NEUTROS ABS: 3 10*3/uL (ref 1.4–7.7)
Neutrophils Relative %: 58.9 % (ref 43.0–77.0)
Platelets: 260 10*3/uL (ref 150.0–400.0)
RBC: 4.71 Mil/uL (ref 3.87–5.11)
RDW: 13.4 % (ref 11.5–15.5)
WBC: 5 10*3/uL (ref 4.0–10.5)

## 2014-08-15 LAB — BASIC METABOLIC PANEL
BUN: 12 mg/dL (ref 6–23)
CO2: 29 mEq/L (ref 19–32)
Calcium: 9.1 mg/dL (ref 8.4–10.5)
Chloride: 106 mEq/L (ref 96–112)
Creatinine, Ser: 0.71 mg/dL (ref 0.40–1.20)
GFR: 92.51 mL/min (ref >60.00)
GLUCOSE: 100 mg/dL — AB (ref 70–99)
POTASSIUM: 3.8 meq/L (ref 3.5–5.1)
Sodium: 140 mEq/L (ref 135–145)

## 2014-08-15 LAB — LIPID PANEL
CHOL/HDL RATIO: 3
Cholesterol: 203 mg/dL — ABNORMAL HIGH (ref 0–200)
HDL: 59.4 mg/dL (ref >39.00)
LDL Cholesterol: 131 mg/dL — ABNORMAL HIGH (ref 0–99)
NonHDL: 143.6
TRIGLYCERIDES: 64 mg/dL (ref 0.0–149.0)
VLDL: 12.8 mg/dL (ref 0.0–40.0)

## 2014-08-15 LAB — HEPATIC FUNCTION PANEL
ALBUMIN: 4 g/dL (ref 3.5–5.2)
ALT: 12 U/L (ref 0–35)
AST: 14 U/L (ref 0–37)
Alkaline Phosphatase: 56 U/L (ref 39–117)
Bilirubin, Direct: 0.1 mg/dL (ref 0.0–0.3)
Total Bilirubin: 1 mg/dL (ref 0.2–1.2)
Total Protein: 7 g/dL (ref 6.0–8.3)

## 2014-08-15 LAB — VITAMIN D 25 HYDROXY (VIT D DEFICIENCY, FRACTURES): VITD: 20.16 ng/mL — AB (ref 30.00–100.00)

## 2014-08-15 LAB — TSH: TSH: 0.84 u[IU]/mL (ref 0.35–4.50)

## 2014-08-15 MED ORDER — VITAMIN D (ERGOCALCIFEROL) 1.25 MG (50000 UNIT) PO CAPS: 50000.0000 [IU] | ORAL_CAPSULE | ORAL | Status: DC

## 2014-08-15 NOTE — Assessment & Plan Note (Signed)
New.  Bottom of L foot.  Consistent w/ either ganglion or morton's neuroma.  Nontender.  Pt is not interested in imaging at this time.  Reviewed parameters that would warrant further work up- growth, change, pain.  Pt expressed understanding and is in agreement w/ plan.

## 2014-08-15 NOTE — Assessment & Plan Note (Signed)
Pt's PE WNL.  UTD on pap, mammo.  Due for colonoscopy since turning 49- referral entered.  Check labs.  Anticipatory guidance provided.

## 2014-08-15 NOTE — Progress Notes (Signed)
Pre visit review using our clinic review tool, if applicable. No additional management support is needed unless otherwise documented below in the visit note. 

## 2014-08-15 NOTE — Patient Instructions (Signed)
Follow up 1 year or as needed We'll notify you of your lab results and make any changes if needed We'll call you with your GI appt Call with any questions or concerns Keep up the good work!  You look great! If the area on your foot changes in size, shape, or pain- let me know! Happy Mother's Day!!!

## 2014-08-15 NOTE — Progress Notes (Signed)
   Subjective:    Patient ID: Briana Holden, female    DOB: 1965/04/20, 49 y.o.   MRN: [id number]  HPI CPE- UTD on mammo.  Due for colonoscopy since turning 49.  UTD on pap.  Soft tissue mass bottom of L foot- appeared ~1 yr ago, has not changed in size.  Not painful.   Review of Systems Patient reports no vision/ hearing changes, adenopathy,fever, weight change,  persistant/recurrent hoarseness , swallowing issues, chest pain, palpitations, edema, persistant/recurrent cough, hemoptysis, dyspnea (rest/exertional/paroxysmal nocturnal), gastrointestinal bleeding (melena, rectal bleeding), abdominal pain, significant heartburn, bowel changes, GU symptoms (dysuria, hematuria, incontinence), Gyn symptoms (abnormal  bleeding, pain),  syncope, focal weakness, memory loss, numbness & tingling, skin/hair/nail changes, abnormal bruising or bleeding, anxiety, or depression.     Objective:   Physical Exam General Appearance:    Alert, cooperative, no distress, appears stated age  Head:    Normocephalic, without obvious abnormality, atraumatic  Eyes:    PERRL, conjunctiva/corneas clear, EOM's intact, fundi    benign, both eyes  Ears:    Normal TM's and external ear canals, both ears  Nose:   Nares normal, septum midline, mucosa normal, no drainage    or sinus tenderness  Throat:   Lips, mucosa, and tongue normal; teeth and gums normal  Neck:   Supple, symmetrical, trachea midline, no adenopathy;    Thyroid: no enlargement/tenderness/nodules  Back:     Symmetric, no curvature, ROM normal, no CVA tenderness  Lungs:     Clear to auscultation bilaterally, respirations unlabored  Chest Wall:    No tenderness or deformity   Heart:    Regular rate and rhythm, S1 and S2 normal, no murmur, rub   or gallop  Breast Exam:    Deferred to mammo  Abdomen:     Soft, non-tender, bowel sounds active all four quadrants,    no masses, no organomegaly  Genitalia:    Deferred  Rectal:    Extremities:   Extremities  normal, atraumatic, no cyanosis or edema.  Soft tissue mass on bottom of L foot- not painful, freely mobile.  Pulses:   2+ and symmetric all extremities  Skin:   Skin color, texture, turgor normal, no rashes or lesions  Lymph nodes:   Cervical, supraclavicular, and axillary nodes normal  Neurologic:   CNII-XII intact, normal strength, sensation and reflexes    throughout          Assessment & Plan:

## 2014-11-07 ENCOUNTER — Encounter: Payer: Self-pay | Admitting: Gastroenterology

## 2014-12-01 ENCOUNTER — Other Ambulatory Visit: Payer: Self-pay | Admitting: Family Medicine

## 2014-12-03 NOTE — Telephone Encounter (Signed)
Med denied, pt should continue OTC 2000iu vitamin d daily.

## 2014-12-27 ENCOUNTER — Ambulatory Visit (AMBULATORY_SURGERY_CENTER): Payer: Self-pay

## 2014-12-27 VITALS — Ht 67.75 in | Wt 170.0 lb

## 2014-12-27 DIAGNOSIS — Z1211 Encounter for screening for malignant neoplasm of colon: Secondary | ICD-10-CM

## 2014-12-27 MED ORDER — SUPREP BOWEL PREP KIT 17.5-3.13-1.6 GM/177ML PO SOLN: 1.0000 | Freq: Once | ORAL | Status: DC

## 2014-12-27 NOTE — Progress Notes (Signed)
No allergies to eggs or soy No diet/weight loss meds No home oxygen No past problems with anesthesia  Has email  Emmi instructions given for colonoscopy 

## 2015-01-03 ENCOUNTER — Telehealth: Payer: Self-pay | Admitting: Gastroenterology

## 2015-01-03 NOTE — Telephone Encounter (Signed)
Left vm for pt to callback 

## 2015-01-04 NOTE — Telephone Encounter (Signed)
Called pt and left vm for pt to call me back. I am trying to give pt a sample of suprep.

## 2015-01-04 NOTE — Telephone Encounter (Signed)
Pt called and I informed her that I will leave a sample at the front desk for her to pick up. She understands and states she will be in next week.

## 2015-01-10 ENCOUNTER — Encounter: Payer: Self-pay | Admitting: Gastroenterology

## 2015-01-10 ENCOUNTER — Ambulatory Visit (AMBULATORY_SURGERY_CENTER): Payer: BLUE CROSS/BLUE SHIELD | Admitting: Gastroenterology

## 2015-01-10 VITALS — BP 117/58 | HR 66 | Temp 97.3°F | Resp 16 | Ht 67.0 in | Wt 170.0 lb

## 2015-01-10 DIAGNOSIS — D120 Benign neoplasm of cecum: Secondary | ICD-10-CM

## 2015-01-10 DIAGNOSIS — D122 Benign neoplasm of ascending colon: Secondary | ICD-10-CM | POA: Diagnosis not present

## 2015-01-10 DIAGNOSIS — Z1211 Encounter for screening for malignant neoplasm of colon: Secondary | ICD-10-CM

## 2015-01-10 DIAGNOSIS — D128 Benign neoplasm of rectum: Secondary | ICD-10-CM

## 2015-01-10 DIAGNOSIS — K621 Rectal polyp: Secondary | ICD-10-CM

## 2015-01-10 MED ORDER — SODIUM CHLORIDE 0.9 % IV SOLN: 500.0000 mL | INTRAVENOUS | Status: DC

## 2015-01-10 NOTE — Op Note (Signed)
[place]  [address]. [place], [address]   COLONOSCOPY PROCEDURE REPORT  PATIENT: Briana Holden, Briana Holden  MR#: [id number] BIRTHDATE: 18-Aug-1965 , 49  yrs. old GENDER: female ENDOSCOPIST: Fox Chase Cellar, MD REFERRED BY: Annye Asa MD PROCEDURE DATE:  01/10/2015 PROCEDURE:   Colonoscopy with snare polypectomy First Screening Colonoscopy - Avg.  risk and is 50 yrs.  old or older Yes.  Prior Negative Screening - Now for repeat screening. N/A  History of Adenoma - Now for follow-up colonoscopy & has been > or = to 3 yrs.  N/A  Polyps removed today? Yes ASA CLASS:   Class II INDICATIONS:Screening for colonic neoplasia and Colorectal Neoplasm Risk Assessment for this procedure is average risk. MEDICATIONS: Propofol 400 mg IV  DESCRIPTION OF PROCEDURE:   After the risks benefits and alternatives of the procedure were thoroughly explained, informed consent was obtained.  The digital rectal exam revealed no abnormalities of the rectum.   The LB [id number]  endoscope was introduced through the anus and advanced to the cecum, which was identified by both the appendix and ileocecal valve. No adverse events experienced.   The quality of the prep was good.  The instrument was then slowly withdrawn as the colon was fully examined. Estimated blood loss is zero unless otherwise noted in this procedure report.    COLON FINDINGS: A flat polyp measuring 7 mm in size was found at the cecum.  A polypectomy was performed with a cold snare.  The resection was complete, the polyp tissue was completely retrieved and sent to histology.   Two sessile polyps measuring 5 mm in size were found in the ascending colon.  Polypectomies were performed with a cold snare.  The resection was complete, the polyp tissue was completely retrieved and sent to histology.   A semi-pedunculated polyp measuring 6 mm in size was found in the rectum.  A polypectomy was performed using snare  cautery.  The resection was complete, the polyp tissue was completely retrieved and sent to histology.   The examination was otherwise normal. Retroflexed views revealed internal hemorrhoids. The time to cecum = 3.5 Withdrawal time = 25.6   The scope was withdrawn and the procedure completed.  COMPLICATIONS: There were no immediate complications.   ENDOSCOPIC IMPRESSION: 1.   Flat polyp was found at the cecum; polypectomy was performed with a cold snare 2.   Two sessile polyps were found in the ascending colon; polypectomies were performed with a cold snare 3.   Semi-pedunculated polyp was found in the rectum; polypectomy was performed using snare cautery 4.   The examination was otherwise normal  RECOMMENDATIONS: 1.  Hold Aspirin and all other NSAIDS for 2 weeks. 2.  Await pathology results 3.  Resume diet 4.  Resume medications  eSigned:  Dana Cellar, MD 01/10/2015 8:37 AM   cc: Annye Asa MD, the patient   PATIENT NAME:  Briana Holden, Briana Holden MR#: [id number]

## 2015-01-10 NOTE — Progress Notes (Signed)
Report to PACU, RN, vss, BBS= Clear.  

## 2015-01-10 NOTE — Patient Instructions (Signed)
HOLD ASPIRIN, ASPIRIN PRODUCTS AND NSAIDS ( MOTRIN, ADVIL,IBUPROFEN, ALEVE, ETC ) FOR TWO WEEKS, 01/24/15.    YOU HAD AN ENDOSCOPIC PROCEDURE TODAY AT [facility]:   Refer to the procedure report that was given to you for any specific questions about what was found during the examination.  If the procedure report does not answer your questions, please call your gastroenterologist to clarify.  If you requested that your care partner not be given the details of your procedure findings, then the procedure report has been included in a sealed envelope for you to review at your convenience later.  YOU SHOULD EXPECT: Some feelings of bloating in the abdomen. Passage of more gas than usual.  Walking can help get rid of the air that was put into your GI tract during the procedure and reduce the bloating. If you had a lower endoscopy (such as a colonoscopy or flexible sigmoidoscopy) you may notice spotting of blood in your stool or on the toilet paper. If you underwent a bowel prep for your procedure, you may not have a normal bowel movement for a few days.  Please Note:  You might notice some irritation and congestion in your nose or some drainage.  This is from the oxygen used during your procedure.  There is no need for concern and it should clear up in a day or so.  SYMPTOMS TO REPORT IMMEDIATELY:   Following lower endoscopy (colonoscopy or flexible sigmoidoscopy):  Excessive amounts of blood in the stool  Significant tenderness or worsening of abdominal pains  Swelling of the abdomen that is new, acute  Fever of 100F or higher  For urgent or emergent issues, a gastroenterologist can be reached at any hour by calling [phone].   DIET: Your first meal following the procedure should be a small meal and then it is ok to progress to your normal diet. Heavy or fried foods are harder to digest and may make you feel nauseous or bloated.  Likewise, meals heavy in dairy and vegetables  can increase bloating.  Drink plenty of fluids but you should avoid alcoholic beverages for 24 hours.  ACTIVITY:  You should plan to take it easy for the rest of today and you should NOT DRIVE or use heavy machinery until tomorrow (because of the sedation medicines used during the test).    FOLLOW UP: Our staff will call the number listed on your records the next business day following your procedure to check on you and address any questions or concerns that you may have regarding the information given to you following your procedure. If we do not reach you, we will leave a message.  However, if you are feeling well and you are not experiencing any problems, there is no need to return our call.  We will assume that you have returned to your regular daily activities without incident.  If any biopsies were taken you will be contacted by phone or by letter within the next 1-3 weeks.  Please call us at [phone] if you have not heard about the biopsies in 3 weeks.    SIGNATURES/CONFIDENTIALITY: You and/or your care partner have signed paperwork which will be entered into your electronic medical record.  These signatures attest to the fact that that the information above on your After Visit Summary has been reviewed and is understood.  Full responsibility of the confidentiality of this discharge information lies with you and/or your care-partner.

## 2015-01-10 NOTE — Progress Notes (Signed)
Called to room to assist during endoscopic procedure.  Patient ID and intended procedure confirmed with present staff. Received instructions for my participation in the procedure from the performing physician.  

## 2015-01-11 ENCOUNTER — Telehealth: Payer: Self-pay | Admitting: *Deleted

## 2015-01-11 NOTE — Telephone Encounter (Signed)
  Follow up Call-  Call back number 01/10/2015  Post procedure Call Back phone  # [phone]  Permission to leave phone message Yes     Patient questions:  Do you have a fever, pain , or abdominal swelling? No. Pain Score  0 *  Have you tolerated food without any problems? Yes.    Have you been able to return to your normal activities? Yes.    Do you have any questions about your discharge instructions: Diet   No. Medications  No. Follow up visit  No.  Do you have questions or concerns about your Care? No.  Actions: * If pain score is 4 or above: No action needed, pain <4.

## 2015-01-16 ENCOUNTER — Encounter: Payer: Self-pay | Admitting: Gastroenterology

## 2015-02-21 ENCOUNTER — Telehealth: Payer: Self-pay | Admitting: Family Medicine

## 2015-02-21 DIAGNOSIS — Z30431 Encounter for routine checking of intrauterine contraceptive device: Secondary | ICD-10-CM

## 2015-02-21 NOTE — Telephone Encounter (Signed)
Relation to [id number] Call back nu mber:[phone]   Reason for call:  Patient GYN closed down and patient states records were transferred over and would like to know when IUD was inserted, please advise

## 2015-02-21 NOTE — Telephone Encounter (Signed)
Called pt and informed that we do not have complete records on the pt. Pt was advised that IUD was placed after 02/2011.

## 2015-02-21 NOTE — Telephone Encounter (Signed)
Pt was offered an appt with GYN to check to make sure IUD was still intact. Pt advised that she wanted to find her records first. Pt was given the number for [place] office to talk to records department.

## 2015-03-19 NOTE — Telephone Encounter (Signed)
Pt called in to check the status of her medical records receipt.    Pt CB: [phone]

## 2015-03-20 NOTE — Telephone Encounter (Signed)
Records have not been received, if they have, I have not seen them. JG//CMA

## 2015-03-22 NOTE — Telephone Encounter (Signed)
As per [facility] Records sent over records Monday, patient would like to know the status

## 2015-03-25 NOTE — Telephone Encounter (Signed)
Records received forwarded to [facility]. Patient did state she would like a follow up call once PCP reviewed IUD records.

## 2015-03-25 NOTE — Telephone Encounter (Signed)
Talked to medical records and they will be faxing them over to [phone]

## 2015-03-25 NOTE — Addendum Note (Signed)
Addended by: Davis Gourd on: 03/25/2015 04:37 PM   Modules accepted: Orders

## 2015-03-25 NOTE — Telephone Encounter (Signed)
Spoke with pt and advised that Dr. Birdie Riddle reviewed her records and advised that referral to GYN was needed. [facility] to [facility] placed today. Records copied one set sent to scanning and another given to [facility] to send to GYN.

## 2015-03-25 NOTE — Telephone Encounter (Signed)
Please notify patient when records are received

## 2015-03-26 NOTE — Telephone Encounter (Signed)
Documents completed with MRN & PCP and sent to scanning/SLS

## 2015-07-29 ENCOUNTER — Other Ambulatory Visit: Payer: Self-pay

## 2015-07-29 DIAGNOSIS — Z1231 Encounter for screening mammogram for malignant neoplasm of breast: Secondary | ICD-10-CM

## 2015-08-19 ENCOUNTER — Ambulatory Visit (INDEPENDENT_AMBULATORY_CARE_PROVIDER_SITE_OTHER): Payer: BLUE CROSS/BLUE SHIELD | Admitting: Family Medicine

## 2015-08-19 ENCOUNTER — Encounter: Payer: Self-pay | Admitting: Family Medicine

## 2015-08-19 VITALS — BP 120/68 | HR 66 | Temp 98.1°F | Resp 16 | Ht 67.0 in | Wt 165.1 lb

## 2015-08-19 DIAGNOSIS — Z0000 Encounter for general adult medical examination without abnormal findings: Secondary | ICD-10-CM

## 2015-08-19 LAB — TSH: TSH: 0.94 u[IU]/mL (ref 0.35–4.50)

## 2015-08-19 LAB — BASIC METABOLIC PANEL
BUN: 14 mg/dL (ref 6–23)
CALCIUM: 9.3 mg/dL (ref 8.4–10.5)
CHLORIDE: 104 meq/L (ref 96–112)
CO2: 29 mEq/L (ref 19–32)
CREATININE: 0.67 mg/dL (ref 0.40–1.20)
GFR: 98.52 mL/min (ref >60.00)
Glucose, Bld: 86 mg/dL (ref 70–99)
Potassium: 4.3 mEq/L (ref 3.5–5.1)
Sodium: 139 mEq/L (ref 135–145)

## 2015-08-19 LAB — HEPATIC FUNCTION PANEL
ALBUMIN: 4.4 g/dL (ref 3.5–5.2)
ALT: 14 U/L (ref 0–35)
AST: 16 U/L (ref 0–37)
Alkaline Phosphatase: 59 U/L (ref 39–117)
Bilirubin, Direct: 0.2 mg/dL (ref 0.0–0.3)
TOTAL PROTEIN: 7.1 g/dL (ref 6.0–8.3)
Total Bilirubin: 1 mg/dL (ref 0.2–1.2)

## 2015-08-19 LAB — LIPID PANEL
CHOL/HDL RATIO: 3
Cholesterol: 224 mg/dL — ABNORMAL HIGH (ref 0–200)
HDL: 71.4 mg/dL (ref >39.00)
LDL CALC: 142 mg/dL — AB (ref 0–99)
NonHDL: 152.27
TRIGLYCERIDES: 49 mg/dL (ref 0.0–149.0)
VLDL: 9.8 mg/dL (ref 0.0–40.0)

## 2015-08-19 LAB — CBC WITH DIFFERENTIAL/PLATELET
BASOS ABS: 0 10*3/uL (ref 0.0–0.1)
Basophils Relative: 0.6 % (ref 0.0–3.0)
Eosinophils Absolute: 0.1 10*3/uL (ref 0.0–0.7)
Eosinophils Relative: 2.4 % (ref 0.0–5.0)
HEMATOCRIT: 41.6 % (ref 36.0–46.0)
Hemoglobin: 14.1 g/dL (ref 12.0–15.0)
LYMPHS PCT: 31.5 % (ref 12.0–46.0)
Lymphs Abs: 1.6 10*3/uL (ref 0.7–4.0)
MCHC: 33.8 g/dL (ref 30.0–36.0)
MCV: 89.2 fl (ref 78.0–100.0)
MONOS PCT: 9.7 % (ref 3.0–12.0)
Monocytes Absolute: 0.5 10*3/uL (ref 0.1–1.0)
Neutro Abs: 2.8 10*3/uL (ref 1.4–7.7)
Neutrophils Relative %: 55.8 % (ref 43.0–77.0)
Platelets: 272 10*3/uL (ref 150.0–400.0)
RBC: 4.67 Mil/uL (ref 3.87–5.11)
RDW: 13.6 % (ref 11.5–15.5)
WBC: 5 10*3/uL (ref 4.0–10.5)

## 2015-08-19 LAB — VITAMIN D 25 HYDROXY (VIT D DEFICIENCY, FRACTURES): VITD: 26.15 ng/mL — ABNORMAL LOW (ref 30.00–100.00)

## 2015-08-19 NOTE — Progress Notes (Signed)
   Subjective:    Patient ID: Briana Holden, female    DOB: Jul 13, 1965, 50 y.o.   MRN: [id number]  HPI CPE- UTD on colonoscopy (due 2019), has mammo scheduled for tomorrow.  UTD on pap smear (due next year).     Review of Systems Patient reports no vision/ hearing changes, adenopathy,fever, weight change,  persistant/recurrent hoarseness , swallowing issues, chest pain, palpitations, edema, persistant/recurrent cough, hemoptysis, dyspnea (rest/exertional/paroxysmal nocturnal), gastrointestinal bleeding (melena, rectal bleeding), abdominal pain, significant heartburn, bowel changes, GU symptoms (dysuria, hematuria, incontinence), Gyn symptoms (abnormal  bleeding, pain),  syncope, focal weakness, memory loss, numbness & tingling, skin/hair/nail changes, abnormal bruising or bleeding, anxiety, or depression.     Objective:   Physical Exam General Appearance:    Alert, cooperative, no distress, appears stated age  Head:    Normocephalic, without obvious abnormality, atraumatic  Eyes:    PERRL, conjunctiva/corneas clear, EOM's intact, fundi    benign, both eyes  Ears:    Normal TM's and external ear canals, both ears  Nose:   Nares normal, septum midline, mucosa normal, no drainage    or sinus tenderness  Throat:   Lips, mucosa, and tongue normal; teeth and gums normal  Neck:   Supple, symmetrical, trachea midline, no adenopathy;    Thyroid: no enlargement/tenderness/nodules  Back:     Symmetric, no curvature, ROM normal, no CVA tenderness  Lungs:     Clear to auscultation bilaterally, respirations unlabored  Chest Wall:    No tenderness or deformity   Heart:    Regular rate and rhythm, S1 and S2 normal, no murmur, rub   or gallop  Breast Exam:    Deferred to mammo  Abdomen:     Soft, non-tender, bowel sounds active all four quadrants,    no masses, no organomegaly  Genitalia:    Deferred  Rectal:    Extremities:   Extremities normal, atraumatic, no cyanosis or edema  Pulses:   2+ and  symmetric all extremities  Skin:   Skin color, texture, turgor normal, no rashes or lesions  Lymph nodes:   Cervical, supraclavicular, and axillary nodes normal  Neurologic:   CNII-XII intact, normal strength, sensation and reflexes    throughout          Assessment & Plan:

## 2015-08-19 NOTE — Patient Instructions (Signed)
Follow up in 1 year or as needed We'll notify you of your lab results and make any changes if needed Keep up the good work!  You look great! Call with any questions or concerns Thanks for sticking with [place]!!! Enjoy your summer!!!

## 2015-08-19 NOTE — Assessment & Plan Note (Signed)
Pt's PE WNL.  UTD on colonoscopy, mammo.  Due for pap next year.  Check labs.  Anticipatory guidance provided.

## 2015-08-19 NOTE — Progress Notes (Signed)
Pre visit review using our clinic review tool, if applicable. No additional management support is needed unless otherwise documented below in the visit note. 

## 2015-08-20 ENCOUNTER — Ambulatory Visit
Admission: RE | Admit: 2015-08-20 | Discharge: 2015-08-20 | Disposition: A | Discharge status: Home / Self Care | Payer: BLUE CROSS/BLUE SHIELD | Source: Ambulatory Visit

## 2015-08-20 DIAGNOSIS — Z1231 Encounter for screening mammogram for malignant neoplasm of breast: Secondary | ICD-10-CM

## 2015-08-21 ENCOUNTER — Other Ambulatory Visit: Payer: Self-pay | Admitting: Family Medicine

## 2015-08-21 DIAGNOSIS — R928 Other abnormal and inconclusive findings on diagnostic imaging of breast: Secondary | ICD-10-CM

## 2015-08-28 ENCOUNTER — Ambulatory Visit
Admission: RE | Admit: 2015-08-28 | Discharge: 2015-08-28 | Disposition: A | Discharge status: Home / Self Care | Payer: BLUE CROSS/BLUE SHIELD | Source: Ambulatory Visit | Attending: Family Medicine | Admitting: Family Medicine

## 2015-08-28 DIAGNOSIS — R928 Other abnormal and inconclusive findings on diagnostic imaging of breast: Secondary | ICD-10-CM

## 2016-08-03 ENCOUNTER — Other Ambulatory Visit: Payer: Self-pay | Admitting: Family Medicine

## 2016-08-03 DIAGNOSIS — Z1231 Encounter for screening mammogram for malignant neoplasm of breast: Secondary | ICD-10-CM

## 2016-08-19 ENCOUNTER — Encounter: Payer: Self-pay | Admitting: Family Medicine

## 2016-08-19 ENCOUNTER — Ambulatory Visit (INDEPENDENT_AMBULATORY_CARE_PROVIDER_SITE_OTHER): Payer: BLUE CROSS/BLUE SHIELD | Admitting: Family Medicine

## 2016-08-19 VITALS — BP 116/78 | HR 76 | Temp 98.5°F | Resp 16 | Ht 67.0 in | Wt 176.5 lb

## 2016-08-19 DIAGNOSIS — E785 Hyperlipidemia, unspecified: Secondary | ICD-10-CM | POA: Diagnosis not present

## 2016-08-19 DIAGNOSIS — Z01419 Encounter for gynecological examination (general) (routine) without abnormal findings: Secondary | ICD-10-CM | POA: Diagnosis not present

## 2016-08-19 DIAGNOSIS — Z0000 Encounter for general adult medical examination without abnormal findings: Secondary | ICD-10-CM

## 2016-08-19 DIAGNOSIS — E559 Vitamin D deficiency, unspecified: Secondary | ICD-10-CM | POA: Insufficient documentation

## 2016-08-19 LAB — CBC WITH DIFFERENTIAL/PLATELET
Basophils Absolute: 0 10*3/uL (ref 0.0–0.1)
Basophils Relative: 1 % (ref 0.0–3.0)
EOS ABS: 0.2 10*3/uL (ref 0.0–0.7)
Eosinophils Relative: 3.3 % (ref 0.0–5.0)
HEMATOCRIT: 44.3 % (ref 36.0–46.0)
Hemoglobin: 14.7 g/dL (ref 12.0–15.0)
LYMPHS ABS: 1.9 10*3/uL (ref 0.7–4.0)
LYMPHS PCT: 36.2 % (ref 12.0–46.0)
MCHC: 33.1 g/dL (ref 30.0–36.0)
MCV: 90.6 fl (ref 78.0–100.0)
MONOS PCT: 9.7 % (ref 3.0–12.0)
Monocytes Absolute: 0.5 10*3/uL (ref 0.1–1.0)
NEUTROS ABS: 2.6 10*3/uL (ref 1.4–7.7)
NEUTROS PCT: 49.8 % (ref 43.0–77.0)
PLATELETS: 306 10*3/uL (ref 150.0–400.0)
RBC: 4.89 Mil/uL (ref 3.87–5.11)
RDW: 13.3 % (ref 11.5–15.5)
WBC: 5.2 10*3/uL (ref 4.0–10.5)

## 2016-08-19 LAB — BASIC METABOLIC PANEL
BUN: 14 mg/dL (ref 6–23)
CALCIUM: 9.4 mg/dL (ref 8.4–10.5)
CO2: 33 meq/L — AB (ref 19–32)
CREATININE: 0.74 mg/dL (ref 0.40–1.20)
Chloride: 104 mEq/L (ref 96–112)
GFR: 87.5 mL/min (ref >60.00)
GLUCOSE: 91 mg/dL (ref 70–99)
Potassium: 4.4 mEq/L (ref 3.5–5.1)
Sodium: 140 mEq/L (ref 135–145)

## 2016-08-19 LAB — TSH: TSH: 1.14 u[IU]/mL (ref 0.35–4.50)

## 2016-08-19 LAB — HEPATIC FUNCTION PANEL
ALBUMIN: 4.5 g/dL (ref 3.5–5.2)
ALT: 13 U/L (ref 0–35)
AST: 14 U/L (ref 0–37)
Alkaline Phosphatase: 65 U/L (ref 39–117)
Bilirubin, Direct: 0.2 mg/dL (ref 0.0–0.3)
TOTAL PROTEIN: 6.8 g/dL (ref 6.0–8.3)
Total Bilirubin: 1.1 mg/dL (ref 0.2–1.2)

## 2016-08-19 LAB — VITAMIN D 25 HYDROXY (VIT D DEFICIENCY, FRACTURES): VITD: 29.19 ng/mL — ABNORMAL LOW (ref 30.00–100.00)

## 2016-08-19 LAB — LIPID PANEL
CHOLESTEROL: 240 mg/dL — AB (ref 0–200)
HDL: 66.5 mg/dL (ref >39.00)
LDL Cholesterol: 156 mg/dL — ABNORMAL HIGH (ref 0–99)
NonHDL: 173.54
TRIGLYCERIDES: 87 mg/dL (ref 0.0–149.0)
Total CHOL/HDL Ratio: 4
VLDL: 17.4 mg/dL (ref 0.0–40.0)

## 2016-08-19 NOTE — Patient Instructions (Signed)
Follow up in 1 year or as needed We'll notify you of your lab results and make any changes if needed Continue to work on healthy diet and regular exercise- you look great! We'll call you with your GYN appt Call with any questions or concerns Happy Mother's Day!!!

## 2016-08-19 NOTE — Progress Notes (Signed)
Pre visit review using our clinic review tool, if applicable. No additional management support is needed unless otherwise documented below in the visit note. 

## 2016-08-19 NOTE — Progress Notes (Signed)
   Subjective:    Patient ID: Briana Holden, female    DOB: [birth year], 51 y.o.   MRN: [id number]  HPI CPE- UTD on colonoscopy, mammo (scheduled on Friday).  Due to return to GYN for IUD.  UTD on Tdap.   Review of Systems Patient reports no vision/ hearing changes, adenopathy,fever, weight change,  persistant/recurrent hoarseness , swallowing issues, chest pain, palpitations, edema, persistant/recurrent cough, hemoptysis, dyspnea (rest/exertional/paroxysmal nocturnal), gastrointestinal bleeding (melena, rectal bleeding), abdominal pain, significant heartburn, bowel changes, GU symptoms (dysuria, hematuria, incontinence), Gyn symptoms (abnormal  bleeding, pain),  syncope, focal weakness, memory loss, numbness & tingling, skin/hair/nail changes, abnormal bruising or bleeding, anxiety, or depression.     Objective:   Physical Exam General Appearance:    Alert, cooperative, no distress, appears stated age  Head:    Normocephalic, without obvious abnormality, atraumatic  Eyes:    PERRL, conjunctiva/corneas clear, EOM's intact, fundi    benign, both eyes  Ears:    Normal TM's and external ear canals, both ears  Nose:   Nares normal, septum midline, mucosa normal, no drainage    or sinus tenderness  Throat:   Lips, mucosa, and tongue normal; teeth and gums normal  Neck:   Supple, symmetrical, trachea midline, no adenopathy;    Thyroid: no enlargement/tenderness/nodules  Back:     Symmetric, no curvature, ROM normal, no CVA tenderness  Lungs:     Clear to auscultation bilaterally, respirations unlabored  Chest Wall:    No tenderness or deformity   Heart:    Regular rate and rhythm, S1 and S2 normal, no murmur, rub   or gallop  Breast Exam:    Deferred to GYN  Abdomen:     Soft, non-tender, bowel sounds active all four quadrants,    no masses, no organomegaly  Genitalia:    Deferred to GYN  Rectal:    Extremities:   Extremities normal, atraumatic, no cyanosis or edema  Pulses:   2+ and  symmetric all extremities  Skin:   Skin color, texture, turgor normal, no rashes or lesions  Lymph nodes:   Cervical, supraclavicular, and axillary nodes normal  Neurologic:   CNII-XII intact, normal strength, sensation and reflexes    throughout          Assessment & Plan:

## 2016-08-19 NOTE — Assessment & Plan Note (Signed)
Check labs, replete prn.

## 2016-08-19 NOTE — Assessment & Plan Note (Signed)
Pt's PE WNL w/ exception of being overweight.  UTD on colonoscopy, Tdap, mammo.  Refer back to GYN for pap.  Check labs.  Anticipatory guidance provided.

## 2016-08-21 ENCOUNTER — Ambulatory Visit
Admission: RE | Admit: 2016-08-21 | Discharge: 2016-08-21 | Disposition: A | Discharge status: Home / Self Care | Payer: BLUE CROSS/BLUE SHIELD | Source: Ambulatory Visit | Attending: Family Medicine | Admitting: Family Medicine

## 2016-08-21 ENCOUNTER — Other Ambulatory Visit: Payer: Self-pay | Admitting: General Practice

## 2016-08-21 DIAGNOSIS — Z1231 Encounter for screening mammogram for malignant neoplasm of breast: Secondary | ICD-10-CM

## 2016-08-21 DIAGNOSIS — E785 Hyperlipidemia, unspecified: Secondary | ICD-10-CM

## 2016-08-21 MED ORDER — ATORVASTATIN CALCIUM 20 MG PO TABS: 20.0000 mg | ORAL_TABLET | Freq: Every day | ORAL | 3 refills | Status: DC

## 2016-09-02 LAB — HM PAP SMEAR: HM PAP: NORMAL

## 2016-10-13 ENCOUNTER — Ambulatory Visit (INDEPENDENT_AMBULATORY_CARE_PROVIDER_SITE_OTHER): Payer: BLUE CROSS/BLUE SHIELD | Admitting: Physician Assistant

## 2016-10-13 ENCOUNTER — Encounter: Payer: Self-pay | Admitting: Physician Assistant

## 2016-10-13 VITALS — BP 102/62 | HR 64 | Temp 98.8°F | Resp 14 | Ht 67.0 in | Wt 162.0 lb

## 2016-10-13 DIAGNOSIS — G5712 Meralgia paresthetica, left lower limb: Secondary | ICD-10-CM

## 2016-10-13 MED ORDER — METHYLPREDNISOLONE 4 MG PO TBPK: ORAL_TABLET | ORAL | 0 refills | Status: DC
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
## 2016-10-13 NOTE — Patient Instructions (Signed)
Please take medication as directed. Avoid elevating the left lower extremity so much at night. Wear loose-fitting clothing.  Symptoms are usually self-limited and resolve on their own. Our goal is to expedite this process.  If not improving we will need assessment with Neurology.

## 2016-10-13 NOTE — Progress Notes (Signed)
Pre visit review using our clinic review tool, if applicable. No additional management support is needed unless otherwise documented below in the visit note. 

## 2017-05-24 ENCOUNTER — Other Ambulatory Visit: Payer: Self-pay | Admitting: Family Medicine

## 2017-05-24 ENCOUNTER — Other Ambulatory Visit: Payer: Self-pay | Admitting: General Practice

## 2017-05-24 DIAGNOSIS — E785 Hyperlipidemia, unspecified: Secondary | ICD-10-CM

## 2017-05-25 ENCOUNTER — Other Ambulatory Visit (INDEPENDENT_AMBULATORY_CARE_PROVIDER_SITE_OTHER): Payer: BLUE CROSS/BLUE SHIELD

## 2017-05-25 DIAGNOSIS — E785 Hyperlipidemia, unspecified: Secondary | ICD-10-CM

## 2017-05-25 LAB — HEPATIC FUNCTION PANEL
ALT: 13 U/L (ref 0–35)
AST: 16 U/L (ref 0–37)
Albumin: 4.3 g/dL (ref 3.5–5.2)
Alkaline Phosphatase: 58 U/L (ref 39–117)
BILIRUBIN TOTAL: 1.3 mg/dL — AB (ref 0.2–1.2)
Bilirubin, Direct: 0.2 mg/dL (ref 0.0–0.3)
TOTAL PROTEIN: 6.6 g/dL (ref 6.0–8.3)

## 2017-05-25 LAB — LIPID PANEL
CHOLESTEROL: 217 mg/dL — AB (ref 0–200)
HDL: 67.8 mg/dL (ref >39.00)
LDL CALC: 136 mg/dL — AB (ref 0–99)
NonHDL: 149.46
TRIGLYCERIDES: 66 mg/dL (ref 0.0–149.0)
Total CHOL/HDL Ratio: 3
VLDL: 13.2 mg/dL (ref 0.0–40.0)

## 2017-05-26 ENCOUNTER — Encounter: Payer: Self-pay | Admitting: General Practice

## 2017-05-26 ENCOUNTER — Encounter: Payer: Self-pay | Admitting: Family Medicine

## 2017-05-27 ENCOUNTER — Telehealth: Payer: Self-pay | Admitting: *Deleted

## 2017-05-27 NOTE — Telephone Encounter (Signed)
Ok to cancel appt since labs look good.  Will see her for her CPE in May

## 2017-05-27 NOTE — Telephone Encounter (Signed)
Patient asking if she needed to come back up here for her appointment next week or if it could wait until May.  She had her labs done two days ago, and if she just needs to keep doing what she is doing then she is good with that, and did not want to make an extra trip up here just to be told to continue with what she is doing.   Her CPE is scheduled in May and she is keeping that appointment.   She is aware that I am routing message to Dr. Birdie Riddle and I will call her back and let her know.

## 2017-05-27 NOTE — Telephone Encounter (Signed)
Patient is aware that it is okay to cancel appt.   She will keep her CPE in May.  Appt canceled for patient.

## 2017-06-03 ENCOUNTER — Ambulatory Visit: Payer: BLUE CROSS/BLUE SHIELD | Admitting: Family Medicine

## 2017-08-24 ENCOUNTER — Encounter: Payer: BLUE CROSS/BLUE SHIELD | Admitting: Family Medicine

## 2017-08-30 ENCOUNTER — Other Ambulatory Visit: Payer: Self-pay | Admitting: Obstetrics and Gynecology

## 2017-08-30 DIAGNOSIS — Z139 Encounter for screening, unspecified: Secondary | ICD-10-CM

## 2017-08-30 DIAGNOSIS — Z01419 Encounter for gynecological examination (general) (routine) without abnormal findings: Secondary | ICD-10-CM | POA: Diagnosis not present

## 2017-08-30 DIAGNOSIS — N951 Menopausal and female climacteric states: Secondary | ICD-10-CM

## 2017-08-30 DIAGNOSIS — Z780 Asymptomatic menopausal state: Secondary | ICD-10-CM | POA: Diagnosis not present

## 2017-08-30 DIAGNOSIS — Z681 Body mass index (BMI) 19 or less, adult: Secondary | ICD-10-CM | POA: Diagnosis not present

## 2017-10-04 ENCOUNTER — Telehealth: Payer: Self-pay | Admitting: *Deleted

## 2017-10-04 NOTE — Telephone Encounter (Signed)
Ok to transfer. 

## 2017-10-04 NOTE — Telephone Encounter (Signed)
Copied from [place] [phone]. Topic: Appointment Scheduling - Scheduling Inquiry for Clinic >> Oct 04, 2017  3:28 PM Briana Holden wrote: Reason for CRM: Patient calling to switch from Dr. Birdie Riddle at [facility] to Dr. Rodman Key at [place] due to location. Please call back to let her know if her request is approved or denied. Will need a cholesterol check when she see's [facility].

## 2017-10-08 ENCOUNTER — Encounter: Payer: Self-pay | Admitting: General Practice

## 2017-10-08 ENCOUNTER — Ambulatory Visit
Admission: RE | Admit: 2017-10-08 | Discharge: 2017-10-08 | Disposition: A | Discharge status: Home / Self Care | Payer: BLUE CROSS/BLUE SHIELD | Source: Ambulatory Visit | Attending: Obstetrics and Gynecology | Admitting: Obstetrics and Gynecology

## 2017-10-08 DIAGNOSIS — M8589 Other specified disorders of bone density and structure, multiple sites: Secondary | ICD-10-CM | POA: Diagnosis not present

## 2017-10-08 DIAGNOSIS — Z139 Encounter for screening, unspecified: Secondary | ICD-10-CM

## 2017-10-08 DIAGNOSIS — Z1231 Encounter for screening mammogram for malignant neoplasm of breast: Secondary | ICD-10-CM | POA: Diagnosis not present

## 2017-10-08 DIAGNOSIS — Z780 Asymptomatic menopausal state: Secondary | ICD-10-CM | POA: Diagnosis not present

## 2017-10-08 DIAGNOSIS — N951 Menopausal and female climacteric states: Secondary | ICD-10-CM

## 2017-10-10 NOTE — Telephone Encounter (Signed)
Ok to transfer. 

## 2017-10-11 NOTE — Telephone Encounter (Signed)
Okay to schedule patient. 

## 2017-10-12 ENCOUNTER — Telehealth: Payer: Self-pay

## 2017-10-12 NOTE — Telephone Encounter (Signed)
Spoke with pt and she would like to do labs before her appt on 7.10 in order to get back to her work earlier that day.

## 2017-10-12 NOTE — Telephone Encounter (Signed)
Copied from [place] [phone]. Topic: Appointment Scheduling - Scheduling Inquiry for Clinic >> Oct 12, 2017 11:31 AM Ahmed Prima L wrote: Reason for CRM: Patient will be in for her transfer of care from Dr Birdie Riddle to Dr Zigmund Daniel on 7/10. She will be back on 7/22 for her physical and would like to get her lab work done ahead of time at her 7/10 appointment. She would like to talk about the labs at her physical appointment instead of playing phone tag. Please advise.

## 2017-10-13 ENCOUNTER — Other Ambulatory Visit: Payer: Self-pay | Admitting: Family Medicine

## 2017-10-13 DIAGNOSIS — E785 Hyperlipidemia, unspecified: Secondary | ICD-10-CM

## 2017-10-13 DIAGNOSIS — Z0000 Encounter for general adult medical examination without abnormal findings: Secondary | ICD-10-CM

## 2017-10-20 ENCOUNTER — Ambulatory Visit: Payer: BLUE CROSS/BLUE SHIELD | Admitting: Family Medicine

## 2017-10-20 ENCOUNTER — Encounter: Payer: Self-pay | Admitting: Family Medicine

## 2017-10-20 VITALS — BP 102/80 | HR 66 | Temp 98.1°F | Ht 67.0 in | Wt 128.9 lb

## 2017-10-20 DIAGNOSIS — Z0000 Encounter for general adult medical examination without abnormal findings: Secondary | ICD-10-CM

## 2017-10-20 DIAGNOSIS — M67431 Ganglion, right wrist: Secondary | ICD-10-CM | POA: Insufficient documentation

## 2017-10-20 DIAGNOSIS — M799 Soft tissue disorder, unspecified: Secondary | ICD-10-CM

## 2017-10-20 DIAGNOSIS — E785 Hyperlipidemia, unspecified: Secondary | ICD-10-CM

## 2017-10-20 DIAGNOSIS — G5712 Meralgia paresthetica, left lower limb: Secondary | ICD-10-CM | POA: Diagnosis not present

## 2017-10-20 DIAGNOSIS — M7989 Other specified soft tissue disorders: Secondary | ICD-10-CM

## 2017-10-20 LAB — COMPREHENSIVE METABOLIC PANEL
ALBUMIN: 4.2 g/dL (ref 3.5–5.2)
ALT: 15 U/L (ref 0–35)
AST: 17 U/L (ref 0–37)
Alkaline Phosphatase: 58 U/L (ref 39–117)
BUN: 17 mg/dL (ref 6–23)
CALCIUM: 9.1 mg/dL (ref 8.4–10.5)
CHLORIDE: 104 meq/L (ref 96–112)
CO2: 32 mEq/L (ref 19–32)
CREATININE: 0.77 mg/dL (ref 0.40–1.20)
GFR: 83.2 mL/min (ref >60.00)
Glucose, Bld: 96 mg/dL (ref 70–99)
POTASSIUM: 4.3 meq/L (ref 3.5–5.1)
Sodium: 139 mEq/L (ref 135–145)
Total Bilirubin: 1.1 mg/dL (ref 0.2–1.2)
Total Protein: 6.8 g/dL (ref 6.0–8.3)

## 2017-10-20 LAB — VITAMIN D 25 HYDROXY (VIT D DEFICIENCY, FRACTURES): VITD: 45.05 ng/mL (ref 30.00–100.00)

## 2017-10-20 LAB — LIPID PANEL
CHOL/HDL RATIO: 3
CHOLESTEROL: 225 mg/dL — AB (ref 0–200)
HDL: 84.6 mg/dL (ref >39.00)
LDL Cholesterol: 129 mg/dL — ABNORMAL HIGH (ref 0–99)
NonHDL: 140.58
TRIGLYCERIDES: 58 mg/dL (ref 0.0–149.0)
VLDL: 11.6 mg/dL (ref 0.0–40.0)

## 2017-10-20 LAB — CBC
HEMATOCRIT: 40.3 % (ref 36.0–46.0)
HEMOGLOBIN: 13.7 g/dL (ref 12.0–15.0)
MCHC: 33.9 g/dL (ref 30.0–36.0)
MCV: 90.1 fl (ref 78.0–100.0)
PLATELETS: 223 10*3/uL (ref 150.0–400.0)
RBC: 4.47 Mil/uL (ref 3.87–5.11)
RDW: 13.5 % (ref 11.5–15.5)
WBC: 3.8 10*3/uL — ABNORMAL LOW (ref 4.0–10.5)

## 2017-10-20 LAB — TSH: TSH: 1.45 u[IU]/mL (ref 0.35–4.50)

## 2017-10-20 NOTE — Assessment & Plan Note (Signed)
Area is not bothersome at this time.  Discussed that area can be aspirated or surgically removed, declines either of these at this time.   Will monitor for now.

## 2017-10-20 NOTE — Patient Instructions (Signed)
It was very nice to meet you I'll see you back in a couple of weeks for your physical  For the numbness in your thigh: Meralgia Paresthetica In patients with persistent symptoms for more than one to two months.. Anticonvulsants such as carbamazepine, phenytoin, or gabapentin can be helpful in reducing neuropathic pain symptoms.  Consultation with an anesthesiologist for a local nerve block can also be considered. Injection of a local anesthetic agent, glucocorticoid, or both can be useful to temporarily treat this neuropathy.  Rarely, surgery is necessary in patients with severe chronic symptoms that are refractory to more conservative measures.  ?Decompression of the nerve (sectioning the inferior slip of the attachment of the inguinal ligament to the anterior superior iliac spine) may provide long-lasting relief in some. This procedure has the advantage of preserving sensory function. However, it is not uniformly successful.  ?Sectioning of the lateral femoral cutaneous nerve as it exits the pelvis is the most definitive procedure, but has the disadvantage of permanent anesthesia. From a practical point of view, only patients with intractable dysesthetic pain are willing to undergo a procedure that results in permanent anesthesia.

## 2017-10-20 NOTE — Assessment & Plan Note (Signed)
Did not start lipitor Updated lipid panel collected today and will review at upcoming CPE.

## 2017-10-20 NOTE — Assessment & Plan Note (Signed)
Cystic area noted to R upper chest wall, non-tender.   She will let me know of any changing size or symptoms.
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
## 2017-10-20 NOTE — Assessment & Plan Note (Signed)
Still with continued symptoms Offered trial of anticonvulsant medication such as gabapentin, she declines at this time Discussed PT typically not helpful Surgical options available but would not recommend unless causing significant discomfort or decreased qol.

## 2017-10-28 ENCOUNTER — Encounter

## 2017-10-28 ENCOUNTER — Encounter: Payer: BLUE CROSS/BLUE SHIELD | Admitting: Family Medicine

## 2017-11-01 ENCOUNTER — Ambulatory Visit (INDEPENDENT_AMBULATORY_CARE_PROVIDER_SITE_OTHER): Payer: BLUE CROSS/BLUE SHIELD | Admitting: Family Medicine

## 2017-11-01 ENCOUNTER — Encounter: Payer: Self-pay | Admitting: Family Medicine

## 2017-11-01 VITALS — BP 98/60 | HR 58 | Temp 98.2°F | Ht 67.0 in | Wt 128.0 lb

## 2017-11-01 DIAGNOSIS — Z0000 Encounter for general adult medical examination without abnormal findings: Secondary | ICD-10-CM | POA: Diagnosis not present

## 2017-11-01 DIAGNOSIS — D729 Disorder of white blood cells, unspecified: Secondary | ICD-10-CM | POA: Diagnosis not present

## 2017-11-01 LAB — CBC WITH DIFFERENTIAL/PLATELET
BASOS ABS: 0 10*3/uL (ref 0.0–0.1)
Basophils Relative: 0.7 % (ref 0.0–3.0)
EOS ABS: 0.1 10*3/uL (ref 0.0–0.7)
Eosinophils Relative: 3.6 % (ref 0.0–5.0)
HCT: 38.8 % (ref 36.0–46.0)
Hemoglobin: 13.1 g/dL (ref 12.0–15.0)
LYMPHS ABS: 1.5 10*3/uL (ref 0.7–4.0)
Lymphocytes Relative: 40 % (ref 12.0–46.0)
MCHC: 33.7 g/dL (ref 30.0–36.0)
MCV: 90.3 fl (ref 78.0–100.0)
MONO ABS: 0.4 10*3/uL (ref 0.1–1.0)
MONOS PCT: 10.8 % (ref 3.0–12.0)
NEUTROS ABS: 1.6 10*3/uL (ref 1.4–7.7)
NEUTROS PCT: 44.9 % (ref 43.0–77.0)
PLATELETS: 221 10*3/uL (ref 150.0–400.0)
RBC: 4.3 Mil/uL (ref 3.87–5.11)
RDW: 13.9 % (ref 11.5–15.5)
WBC: 3.6 10*3/uL — ABNORMAL LOW (ref 4.0–10.5)
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
## 2017-11-01 NOTE — Patient Instructions (Signed)

## 2017-11-01 NOTE — Assessment & Plan Note (Signed)
Well adult -Recent labs reviewed with her.  Discussed that I did not believe that she needed statin medication at this time to lower cholesterol.  She will continue to work on dietary changes and regular exercise.  -Recheck CBC for low WBC -Immunizations: Up to date -Screenings:  Age appropriate screenings are up to date -Risk factor reduction/Anticipatory guidance:  Recommendations per AVS.

## 2017-11-02 ENCOUNTER — Other Ambulatory Visit: Payer: Self-pay | Admitting: Family Medicine

## 2017-11-02 ENCOUNTER — Telehealth: Payer: Self-pay | Admitting: Family Medicine

## 2017-11-02 DIAGNOSIS — Z806 Family history of leukemia: Secondary | ICD-10-CM

## 2017-11-02 DIAGNOSIS — D72819 Decreased white blood cell count, unspecified: Secondary | ICD-10-CM

## 2017-11-02 NOTE — Progress Notes (Signed)
WBC is slightly lower than previous.  I would recommend hematology evaluation as discussed yesterday and have placed this order.

## 2017-11-02 NOTE — Telephone Encounter (Signed)
Copied from [place] [id number]. Topic: Quick Communication - See Telephone Encounter >> Nov 02, 2017 11:19 AM Ahmed Prima L wrote: CRM for notification. See Telephone encounter for: 11/02/17.  Patient called for lab results. [phone]

## 2017-11-02 NOTE — Telephone Encounter (Signed)
Spoke with pt and informed her of lab results and referral that had been placed. She verbalized understanding

## 2017-11-05 ENCOUNTER — Encounter: Payer: Self-pay | Admitting: Family Medicine

## 2017-11-19 ENCOUNTER — Encounter: Payer: Self-pay | Admitting: Gastroenterology

## 2017-11-30 ENCOUNTER — Encounter: Payer: Self-pay | Admitting: Hematology & Oncology

## 2017-12-01 ENCOUNTER — Encounter: Payer: Self-pay | Admitting: Hematology & Oncology

## 2017-12-01 ENCOUNTER — Other Ambulatory Visit: Payer: Self-pay

## 2017-12-01 ENCOUNTER — Inpatient Hospital Stay: Payer: BLUE CROSS/BLUE SHIELD | Attending: Hematology & Oncology | Admitting: Hematology & Oncology

## 2017-12-01 ENCOUNTER — Inpatient Hospital Stay: Payer: BLUE CROSS/BLUE SHIELD

## 2017-12-01 VITALS — BP 97/60 | HR 75 | Temp 98.8°F | Resp 20 | Ht 67.75 in | Wt 128.1 lb

## 2017-12-01 DIAGNOSIS — Z79899 Other long term (current) drug therapy: Secondary | ICD-10-CM | POA: Diagnosis not present

## 2017-12-01 DIAGNOSIS — G5712 Meralgia paresthetica, left lower limb: Secondary | ICD-10-CM

## 2017-12-01 DIAGNOSIS — D72819 Decreased white blood cell count, unspecified: Secondary | ICD-10-CM | POA: Diagnosis not present

## 2017-12-01 DIAGNOSIS — Z806 Family history of leukemia: Secondary | ICD-10-CM | POA: Insufficient documentation

## 2017-12-01 LAB — CBC WITH DIFFERENTIAL (CANCER CENTER ONLY)
BASOS ABS: 0 10*3/uL (ref 0.0–0.1)
Basophils Relative: 0 %
Eosinophils Absolute: 0.1 10*3/uL (ref 0.0–0.5)
Eosinophils Relative: 2 %
HEMATOCRIT: 40.8 % (ref 34.8–46.6)
Hemoglobin: 13.5 g/dL (ref 11.6–15.9)
LYMPHS PCT: 40 %
Lymphs Abs: 1.8 10*3/uL (ref 0.9–3.3)
MCH: 30.2 pg (ref 26.0–34.0)
MCHC: 33.1 g/dL (ref 32.0–36.0)
MCV: 91.3 fL (ref 81.0–101.0)
MONO ABS: 0.4 10*3/uL (ref 0.1–0.9)
Monocytes Relative: 10 %
NEUTROS ABS: 2.1 10*3/uL (ref 1.5–6.5)
Neutrophils Relative %: 48 %
Platelet Count: 255 10*3/uL (ref 145–400)
RBC: 4.47 MIL/uL (ref 3.70–5.32)
RDW: 13.1 % (ref 11.1–15.7)
WBC Count: 4.5 10*3/uL (ref 3.9–10.0)

## 2017-12-01 LAB — SAVE SMEAR
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
## 2018-01-07 ENCOUNTER — Encounter: Payer: Self-pay | Admitting: *Deleted

## 2018-01-07 ENCOUNTER — Ambulatory Visit (AMBULATORY_SURGERY_CENTER): Payer: Self-pay | Admitting: *Deleted

## 2018-01-07 ENCOUNTER — Other Ambulatory Visit: Payer: Self-pay

## 2018-01-07 VITALS — Ht 68.0 in | Wt 129.0 lb

## 2018-01-07 DIAGNOSIS — Z86010 Personal history of colonic polyps: Secondary | ICD-10-CM

## 2018-01-07 MED ORDER — SUPREP BOWEL PREP KIT 17.5-3.13-1.6 GM/177ML PO SOLN: 1.0000 | Freq: Once | ORAL | 0 refills | Status: AC

## 2018-01-07 NOTE — Progress Notes (Signed)
No egg or soy allergy known to patient  No issues with past sedation with any surgeries  or procedures, no intubation problems  No diet pills per patient No home 02 use per patient  No blood thinners per patient  Pt denies issues with constipation  No A fib or A flutter  EMMI video sent to pt's e mail  Suprep 15 dollar coupon given

## 2018-01-10 ENCOUNTER — Encounter: Payer: Self-pay | Admitting: Gastroenterology

## 2018-01-21 ENCOUNTER — Ambulatory Visit (AMBULATORY_SURGERY_CENTER): Payer: BLUE CROSS/BLUE SHIELD | Admitting: Gastroenterology

## 2018-01-21 ENCOUNTER — Encounter: Payer: Self-pay | Admitting: Gastroenterology

## 2018-01-21 VITALS — BP 95/59 | HR 55 | Temp 97.8°F | Resp 13 | Ht 67.75 in | Wt 128.0 lb

## 2018-01-21 DIAGNOSIS — K635 Polyp of colon: Secondary | ICD-10-CM | POA: Diagnosis not present

## 2018-01-21 DIAGNOSIS — Z86010 Personal history of colonic polyps: Secondary | ICD-10-CM | POA: Diagnosis not present

## 2018-01-21 DIAGNOSIS — D122 Benign neoplasm of ascending colon: Secondary | ICD-10-CM | POA: Diagnosis not present

## 2018-01-21 DIAGNOSIS — D120 Benign neoplasm of cecum: Secondary | ICD-10-CM

## 2018-01-21 DIAGNOSIS — Z1211 Encounter for screening for malignant neoplasm of colon: Secondary | ICD-10-CM | POA: Diagnosis not present

## 2018-01-21 MED ORDER — SODIUM CHLORIDE 0.9 % IV SOLN
500.0000 mL | Freq: Once | INTRAVENOUS | Status: DC
Start: 2018-01-21 — End: 2022-06-22

## 2018-01-21 NOTE — Progress Notes (Signed)
A/ox3, pleased with MAC, report to RN 

## 2018-01-21 NOTE — Progress Notes (Signed)
Called to room to assist during endoscopic procedure.  Patient ID and intended procedure confirmed with present staff. Received instructions for my participation in the procedure from the performing physician.  

## 2018-01-21 NOTE — Progress Notes (Signed)
Pt's states no medical or surgical changes since previsit or office visit. 

## 2018-01-21 NOTE — Op Note (Signed)
[facility] Patient Name: Briana Holden Procedure Date: 01/21/2018 9:45 AM MRN: [id number] Endoscopist: Remo Lipps P. Havery Moros , MD Age: 52 Referring MD:  Date of Birth: [birth year] Gender: Female Account #: [id number] Procedure:                Colonoscopy Indications:              Surveillance: Personal history of multiple                            adenomatous polyps on last colonoscopy 3 years ago Medicines:                Monitored Anesthesia Care Procedure:                Pre-Anesthesia Assessment:                           - Prior to the procedure, a History and Physical                            was performed, and patient medications and                            allergies were reviewed. The patient's tolerance of                            previous anesthesia was also reviewed. The risks                            and benefits of the procedure and the sedation                            options and risks were discussed with the patient.                            All questions were answered, and informed consent                            was obtained. Prior Anticoagulants: The patient has                            taken no previous anticoagulant or antiplatelet                            agents. ASA Grade Assessment: II - A patient with                            mild systemic disease. After reviewing the risks                            and benefits, the patient was deemed in                            satisfactory condition to undergo the procedure.  After obtaining informed consent, the colonoscope                            was passed under direct vision. Throughout the                            procedure, the patient's blood pressure, pulse, and                            oxygen saturations were monitored continuously. The                            Colonoscope was introduced through the anus and                            advanced to  the the cecum, identified by                            appendiceal orifice and ileocecal valve. The                            colonoscopy was performed without difficulty. The                            patient tolerated the procedure well. The quality                            of the bowel preparation was adequate. The                            ileocecal valve, appendiceal orifice, and rectum                            were photographed. Scope In: 9:49:14 AM Scope Out: 10:09:39 AM Scope Withdrawal Time: 0 hours 17 minutes 9 seconds  Total Procedure Duration: 0 hours 20 minutes 25 seconds  Findings:                 The perianal and digital rectal examinations were                            normal.                           Two sessile polyps were found in the cecum. The                            polyps were 3 to 4 mm in size. These polyps were                            removed with a cold snare. Resection and retrieval                            were complete.  A 4 mm polyp was found in the ascending colon. The                            polyp was sessile. The polyp was removed with a                            cold snare. Resection and retrieval were complete.                           Internal hemorrhoids were found during                            retroflexion. The hemorrhoids were medium-sized.                           The colon was tortuous.                           The exam was otherwise without abnormality. Time                            was taken to lavage the colon to achieve adequate                            views. Complications:            No immediate complications. Estimated blood loss:                            Minimal. Estimated Blood Loss:     Estimated blood loss was minimal. Impression:               - Two 3 to 4 mm polyps in the cecum, removed with a                            cold snare. Resected and retrieved.                            - One 4 mm polyp in the ascending colon, removed                            with a cold snare. Resected and retrieved.                           - Internal hemorrhoids.                           - Tortuous colon.                           - The examination was otherwise normal. Recommendation:           - Patient has a contact number available for                            emergencies. The signs and  symptoms of potential                            delayed complications were discussed with the                            patient. Return to normal activities tomorrow.                            Written discharge instructions were provided to the                            patient.                           - Resume previous diet.                           - Continue present medications.                           - Await pathology results. Remo Lipps P. Armbruster, MD 01/21/2018 10:14:20 AM This report has been signed electronically.

## 2018-01-21 NOTE — Patient Instructions (Signed)
YOU HAD AN ENDOSCOPIC PROCEDURE TODAY AT THE [facility]:   Refer to the procedure report that was given to you for any specific questions about what was found during the examination.  If the procedure report does not answer your questions, please call your gastroenterologist to clarify.  If you requested that your care partner not be given the details of your procedure findings, then the procedure report has been included in a sealed envelope for you to review at your convenience later.  YOU SHOULD EXPECT: Some feelings of bloating in the abdomen. Passage of more gas than usual.  Walking can help get rid of the air that was put into your GI tract during the procedure and reduce the bloating. If you had a lower endoscopy (such as a colonoscopy or flexible sigmoidoscopy) you may notice spotting of blood in your stool or on the toilet paper. If you underwent a bowel prep for your procedure, you may not have a normal bowel movement for a few days.  Please Note:  You might notice some irritation and congestion in your nose or some drainage.  This is from the oxygen used during your procedure.  There is no need for concern and it should clear up in a day or so.  SYMPTOMS TO REPORT IMMEDIATELY:   Following lower endoscopy (colonoscopy or flexible sigmoidoscopy):  Excessive amounts of blood in the stool  Significant tenderness or worsening of abdominal pains  Swelling of the abdomen that is new, acute  Fever of 100F or higher  For urgent or emergent issues, a gastroenterologist can be reached at any hour by calling [phone].   DIET:  We do recommend a small meal at first, but then you may proceed to your regular diet.  Drink plenty of fluids but you should avoid alcoholic beverages for 24 hours.  ACTIVITY:  You should plan to take it easy for the rest of today and you should NOT DRIVE or use heavy machinery until tomorrow (because of the sedation medicines used during the test).     FOLLOW UP: Our staff will call the number listed on your records the next business day following your procedure to check on you and address any questions or concerns that you may have regarding the information given to you following your procedure. If we do not reach you, we will leave a message.  However, if you are feeling well and you are not experiencing any problems, there is no need to return our call.  We will assume that you have returned to your regular daily activities without incident.  If any biopsies were taken you will be contacted by phone or by letter within the next 1-3 weeks.  Please call us at [phone] if you have not heard about the biopsies in 3 weeks.    Await for biopsy results Polyps (handout given) Hemorrhoids (handout given)  SIGNATURES/CONFIDENTIALITY: You and/or your care partner have signed paperwork which will be entered into your electronic medical record.  These signatures attest to the fact that that the information above on your After Visit Summary has been reviewed and is understood.  Full responsibility of the confidentiality of this discharge information lies with you and/or your care-partner. 

## 2018-01-24 ENCOUNTER — Telehealth: Payer: Self-pay

## 2018-01-24 NOTE — Telephone Encounter (Signed)
  Follow up Call-  Call back number 01/21/2018  Post procedure Call Back phone  # [phone]  Permission to leave phone message Yes  Some recent data might be hidden     Patient questions:  Do you have a fever, pain , or abdominal swelling? No. Pain Score  0 *  Have you tolerated food without any problems? Yes.    Have you been able to return to your normal activities? Yes.    Do you have any questions about your discharge instructions: Diet   No. Medications  No. Follow up visit  No.  Do you have questions or concerns about your Care? No.  Actions: * If pain score is 4 or above: No action needed, pain <4.

## 2018-01-27 ENCOUNTER — Encounter: Payer: Self-pay | Admitting: *Deleted

## 2018-06-16 ENCOUNTER — Ambulatory Visit: Payer: BLUE CROSS/BLUE SHIELD | Admitting: Family Medicine

## 2018-06-17 ENCOUNTER — Ambulatory Visit: Payer: BLUE CROSS/BLUE SHIELD | Admitting: Family Medicine

## 2018-06-17 ENCOUNTER — Encounter: Payer: Self-pay | Admitting: Family Medicine

## 2018-06-17 ENCOUNTER — Other Ambulatory Visit: Payer: Self-pay

## 2018-06-17 VITALS — BP 96/56 | HR 64 | Temp 97.7°F | Resp 16 | Ht 67.0 in | Wt 131.4 lb

## 2018-06-17 DIAGNOSIS — L255 Unspecified contact dermatitis due to plants, except food: Secondary | ICD-10-CM | POA: Diagnosis not present

## 2018-06-17 MED ORDER — METHYLPREDNISOLONE ACETATE 40 MG/ML IJ SUSP
40.0000 mg | Freq: Once | INTRAMUSCULAR | Status: AC
  Administered 2018-06-17: 40 mg via INTRAMUSCULAR

## 2018-06-17 MED ORDER — PREDNISONE 10 MG PO TABS: ORAL_TABLET | ORAL | 0 refills | Status: DC

## 2018-06-17 NOTE — Assessment & Plan Note (Signed)
-  Injection of depo-medrol 40mg  given today -Start prednisone taper, 1st dose tomorrow.  -Keep areas clean and dry, discussed signs of 2/2 infection.  -F/u if not improving.
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
## 2018-06-17 NOTE — Patient Instructions (Signed)
Start prednisone tomorrow and take taper as directed.  Let me know if not improving or getting worse.    Poison Ivy Dermatitis  Poison ivy dermatitis is inflammation of the skin that is caused by the allergens on the leaves of the poison ivy plant. The skin reaction often involves redness, swelling, blisters, and extreme itching. What are the causes? This condition is caused by a specific chemical (urushiol) found in the sap of the poison ivy plant. This chemical is sticky and can be easily spread to people, animals, and objects. You can get poison ivy dermatitis by:  Having direct contact with a poison ivy plant.  Touching animals, other people, or objects that have come in contact with poison ivy and have the chemical on them. What increases the risk? This condition is more likely to develop in:  People who are outdoors often.  People who go outdoors without wearing protective clothing, such as closed shoes, long pants, and a long-sleeved shirt. What are the signs or symptoms? Symptoms of this condition include:  Redness and itching.  A rash that often includes bumps and blisters. The rash usually appears 48 hours after exposure.  Swelling. This may occur if the reaction is more severe. Symptoms usually last for 1-2 weeks. However, the first time you develop this condition, symptoms may last 3-4 weeks. How is this diagnosed? This condition may be diagnosed based on your symptoms and a physical exam. Your health care provider may also ask you about any recent outdoor activity. How is this treated? Treatment for this condition will vary depending on how severe it is. Treatment may include:  Hydrocortisone creams or calamine lotions to relieve itching.  Oatmeal baths to soothe the skin.  Over-the-counter antihistamine tablets.  Oral steroid medicine for more severe outbreaks. Follow these instructions at home:  Take or apply over-the-counter and prescription medicines only as  told by your health care provider.  Wash exposed skin as soon as possible with soap and cold water.  Use hydrocortisone creams or calamine lotion as needed to soothe the skin and relieve itching.  Take oatmeal baths as needed. Use colloidal oatmeal. You can get this at your local pharmacy or grocery store. Follow the instructions on the packaging.  Do not scratch or rub your skin.  While you have the rash, wash clothes right after you wear them. How is this prevented?   Learn to identify the poison ivy plant and avoid contact with the plant. This plant can be recognized by the number of leaves. Generally, poison ivy has three leaves with flowering branches on a single stem. The leaves are typically glossy, and they have jagged edges that come to a point at the front.  If you have been exposed to poison ivy, thoroughly wash with soap and water right away. You have about 30 minutes to remove the plant resin before it will cause the rash. Be sure to wash under your fingernails because any plant resin there will continue to spread the rash.  When hiking or camping, wear clothes that will help you to avoid exposure on the skin. This includes long pants, a long-sleeved shirt, tall socks, and hiking boots. You can also apply preventive lotion to your skin to help limit exposure.  If you suspect that your clothes or outdoor gear came in contact with poison ivy, rinse them off outside with a garden hose before you bring them inside your house. Contact a health care provider if:  You have open sores  in the rash area.  You have more redness, swelling, or pain in the affected area.  You have redness that spreads beyond the rash area.  You have fluid, blood, or pus coming from the affected area.  You have a fever.  You have a rash over a large area of your body.  You have a rash on your eyes, mouth, or genitals.  Your rash does not improve after a few days. Get help right away if:  Your  face swells or your eyes swell shut.  You have trouble breathing.  You have trouble swallowing. This information is not intended to replace advice given to you by your health care provider. Make sure you discuss any questions you have with your health care provider. Document Released: 03/27/2000 Document Revised: 09/10/2016 Document Reviewed: 09/05/2014 Elsevier Interactive Patient Education  2019 [employer].

## 2018-07-29 ENCOUNTER — Encounter: Payer: Self-pay | Admitting: Family Medicine

## 2018-08-01 NOTE — Telephone Encounter (Signed)
Please see if this has improved, if not please schedule for virtual visit.

## 2018-09-08 ENCOUNTER — Other Ambulatory Visit: Payer: Self-pay | Admitting: Obstetrics and Gynecology

## 2018-09-08 DIAGNOSIS — Z6820 Body mass index (BMI) 20.0-20.9, adult: Secondary | ICD-10-CM | POA: Diagnosis not present

## 2018-09-08 DIAGNOSIS — Z1231 Encounter for screening mammogram for malignant neoplasm of breast: Secondary | ICD-10-CM

## 2018-09-08 DIAGNOSIS — R102 Pelvic and perineal pain: Secondary | ICD-10-CM | POA: Diagnosis not present

## 2018-09-08 DIAGNOSIS — Z01419 Encounter for gynecological examination (general) (routine) without abnormal findings: Secondary | ICD-10-CM | POA: Diagnosis not present

## 2018-09-08 DIAGNOSIS — N6310 Unspecified lump in the right breast, unspecified quadrant: Secondary | ICD-10-CM | POA: Diagnosis not present

## 2018-09-09 ENCOUNTER — Other Ambulatory Visit: Payer: Self-pay | Admitting: Obstetrics and Gynecology

## 2018-09-09 DIAGNOSIS — N6310 Unspecified lump in the right breast, unspecified quadrant: Secondary | ICD-10-CM

## 2018-10-07 ENCOUNTER — Other Ambulatory Visit: Payer: Self-pay | Admitting: Obstetrics and Gynecology

## 2018-10-07 DIAGNOSIS — N63 Unspecified lump in unspecified breast: Secondary | ICD-10-CM

## 2018-10-08 ENCOUNTER — Other Ambulatory Visit: Payer: Self-pay

## 2018-10-08 ENCOUNTER — Emergency Department (HOSPITAL_BASED_OUTPATIENT_CLINIC_OR_DEPARTMENT_OTHER)
Admission: EM | Admit: 2018-10-08 | Discharge: 2018-10-08 | Disposition: A | Discharge status: Home / Self Care | Payer: BC Managed Care – PPO | Attending: Emergency Medicine | Admitting: Emergency Medicine

## 2018-10-08 ENCOUNTER — Emergency Department (HOSPITAL_BASED_OUTPATIENT_CLINIC_OR_DEPARTMENT_OTHER): Payer: BC Managed Care – PPO

## 2018-10-08 ENCOUNTER — Inpatient Hospital Stay: Admission: RE | Admit: 2018-10-08 | Payer: BC Managed Care – PPO | Source: Ambulatory Visit

## 2018-10-08 DIAGNOSIS — Y9355 Activity, bike riding: Secondary | ICD-10-CM | POA: Insufficient documentation

## 2018-10-08 DIAGNOSIS — S81011A Laceration without foreign body, right knee, initial encounter: Secondary | ICD-10-CM | POA: Diagnosis not present

## 2018-10-08 DIAGNOSIS — Y9289 Other specified places as the place of occurrence of the external cause: Secondary | ICD-10-CM | POA: Diagnosis not present

## 2018-10-08 DIAGNOSIS — W19XXXA Unspecified fall, initial encounter: Secondary | ICD-10-CM

## 2018-10-08 DIAGNOSIS — Z79899 Other long term (current) drug therapy: Secondary | ICD-10-CM | POA: Diagnosis not present

## 2018-10-08 DIAGNOSIS — S81021A Laceration with foreign body, right knee, initial encounter: Secondary | ICD-10-CM | POA: Insufficient documentation

## 2018-10-08 DIAGNOSIS — S50311A Abrasion of right elbow, initial encounter: Secondary | ICD-10-CM | POA: Diagnosis not present

## 2018-10-08 DIAGNOSIS — M25521 Pain in right elbow: Secondary | ICD-10-CM | POA: Diagnosis not present

## 2018-10-08 DIAGNOSIS — M25561 Pain in right knee: Secondary | ICD-10-CM | POA: Diagnosis not present

## 2018-10-08 DIAGNOSIS — Y999 Unspecified external cause status: Secondary | ICD-10-CM | POA: Insufficient documentation

## 2018-10-08 DIAGNOSIS — S80211A Abrasion, right knee, initial encounter: Secondary | ICD-10-CM | POA: Diagnosis not present

## 2018-10-08 DIAGNOSIS — T148XXA Other injury of unspecified body region, initial encounter: Secondary | ICD-10-CM

## 2018-10-08 MED ORDER — LIDOCAINE HCL (PF) 1 % IJ SOLN
10.0000 mL | Freq: Once | INTRAMUSCULAR | Status: DC
  Filled 2018-10-08: qty 10

## 2018-10-08 MED ORDER — CEPHALEXIN 500 MG PO CAPS: 500.0000 mg | ORAL_CAPSULE | Freq: Four times a day (QID) | ORAL | 0 refills | Status: DC

## 2018-10-08 NOTE — Discharge Instructions (Signed)
Treatment: Keep your wound dry and dressing applied until this time tomorrow. After 24 hours, you may wash with warm soapy water. Dry and apply antibiotic ointment and clean dressings. Do this daily until your sutures are removed.  Follow-up: Please follow-up with your primary care provider or return to emergency department in 10 days for suture removal. Be aware of signs of infection: fever, increasing pain, redness, swelling, drainage from the area. Please call your primary care provider or return to emergency department if you develop any of these symptoms or if any of the sutures come out prior to removal. Please return to the emergency department if you develop any other new or worsening symptoms.

## 2018-10-08 NOTE — ED Notes (Signed)
Patient ambulated to X-ray and back without difficulty
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
## 2018-10-08 NOTE — ED Notes (Signed)
Wound care completed.

## 2018-10-11 ENCOUNTER — Ambulatory Visit
Admission: RE | Admit: 2018-10-11 | Discharge: 2018-10-11 | Disposition: A | Discharge status: Home / Self Care | Payer: BLUE CROSS/BLUE SHIELD | Source: Ambulatory Visit | Attending: Obstetrics and Gynecology | Admitting: Obstetrics and Gynecology

## 2018-10-11 ENCOUNTER — Other Ambulatory Visit: Payer: Self-pay

## 2018-10-11 DIAGNOSIS — R922 Inconclusive mammogram: Secondary | ICD-10-CM | POA: Diagnosis not present

## 2018-10-11 DIAGNOSIS — N6310 Unspecified lump in the right breast, unspecified quadrant: Secondary | ICD-10-CM | POA: Diagnosis not present

## 2018-10-11 DIAGNOSIS — N63 Unspecified lump in unspecified breast: Secondary | ICD-10-CM

## 2018-10-26 ENCOUNTER — Ambulatory Visit: Payer: BLUE CROSS/BLUE SHIELD

## 2018-11-09 DIAGNOSIS — M79675 Pain in left toe(s): Secondary | ICD-10-CM | POA: Diagnosis not present

## 2018-11-09 DIAGNOSIS — M25552 Pain in left hip: Secondary | ICD-10-CM | POA: Diagnosis not present

## 2019-05-02 ENCOUNTER — Encounter: Payer: Self-pay | Admitting: Family Medicine

## 2019-05-02 DIAGNOSIS — Z0000 Encounter for general adult medical examination without abnormal findings: Secondary | ICD-10-CM

## 2019-05-02 DIAGNOSIS — E559 Vitamin D deficiency, unspecified: Secondary | ICD-10-CM

## 2019-05-03 NOTE — Telephone Encounter (Signed)
I called an spoke with pt and she is on the schedule for an annual with Dr. Ethelene Hal, 1st week In Feb.  Pt would like lab work done prior to visit.  Please advise.

## 2019-05-08 DIAGNOSIS — Z20828 Contact with and (suspected) exposure to other viral communicable diseases: Secondary | ICD-10-CM | POA: Diagnosis not present

## 2019-05-10 ENCOUNTER — Encounter: Payer: Self-pay | Admitting: Family Medicine

## 2019-05-11 ENCOUNTER — Encounter (INDEPENDENT_AMBULATORY_CARE_PROVIDER_SITE_OTHER): Payer: Self-pay

## 2019-05-11 ENCOUNTER — Other Ambulatory Visit: Payer: Self-pay

## 2019-05-11 ENCOUNTER — Telehealth (INDEPENDENT_AMBULATORY_CARE_PROVIDER_SITE_OTHER): Payer: BC Managed Care – PPO | Admitting: Family Medicine

## 2019-05-11 ENCOUNTER — Encounter: Payer: Self-pay | Admitting: Family Medicine

## 2019-05-11 DIAGNOSIS — U071 COVID-19: Secondary | ICD-10-CM

## 2019-05-11 NOTE — Assessment & Plan Note (Signed)
Recent + test for COVID-19.  Mild symptoms at this time.  Continue supportive care at home.  Discussed red flags for which she should seek emergency care including increasing shortness of breath or severe fatigue. Instructed to isolate for a minimum of 10 days and fever free x24 hours and improving symptoms.  Instructed to call with any questions or changing symptoms.
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
## 2019-05-12 ENCOUNTER — Encounter (INDEPENDENT_AMBULATORY_CARE_PROVIDER_SITE_OTHER): Payer: Self-pay

## 2019-05-13 ENCOUNTER — Encounter (INDEPENDENT_AMBULATORY_CARE_PROVIDER_SITE_OTHER): Payer: Self-pay

## 2019-05-14 ENCOUNTER — Encounter (INDEPENDENT_AMBULATORY_CARE_PROVIDER_SITE_OTHER): Payer: Self-pay

## 2019-05-15 ENCOUNTER — Encounter (INDEPENDENT_AMBULATORY_CARE_PROVIDER_SITE_OTHER): Payer: Self-pay

## 2019-05-16 ENCOUNTER — Encounter: Payer: Self-pay | Admitting: Family Medicine

## 2019-05-16 ENCOUNTER — Encounter (INDEPENDENT_AMBULATORY_CARE_PROVIDER_SITE_OTHER): Payer: Self-pay

## 2019-05-16 ENCOUNTER — Telehealth (INDEPENDENT_AMBULATORY_CARE_PROVIDER_SITE_OTHER): Payer: BC Managed Care – PPO | Admitting: Family Medicine

## 2019-05-16 VITALS — Temp 98.0°F | Ht 67.0 in | Wt 131.0 lb

## 2019-05-16 DIAGNOSIS — U071 COVID-19: Secondary | ICD-10-CM

## 2019-05-16 MED ORDER — PREDNISONE 20 MG PO TABS: 20.0000 mg | ORAL_TABLET | Freq: Two times a day (BID) | ORAL | 0 refills | Status: AC

## 2019-05-16 NOTE — Patient Instructions (Signed)
Can
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
## 2019-05-17 ENCOUNTER — Encounter (INDEPENDENT_AMBULATORY_CARE_PROVIDER_SITE_OTHER): Payer: Self-pay

## 2019-05-18 ENCOUNTER — Encounter (INDEPENDENT_AMBULATORY_CARE_PROVIDER_SITE_OTHER): Payer: Self-pay

## 2019-05-19 ENCOUNTER — Encounter: Payer: Self-pay | Admitting: Family Medicine

## 2019-05-19 ENCOUNTER — Encounter (INDEPENDENT_AMBULATORY_CARE_PROVIDER_SITE_OTHER): Payer: Self-pay

## 2019-05-19 ENCOUNTER — Ambulatory Visit: Payer: BC Managed Care – PPO | Admitting: Family Medicine

## 2019-05-21 ENCOUNTER — Encounter (INDEPENDENT_AMBULATORY_CARE_PROVIDER_SITE_OTHER): Payer: Self-pay

## 2019-05-22 ENCOUNTER — Encounter (INDEPENDENT_AMBULATORY_CARE_PROVIDER_SITE_OTHER): Payer: Self-pay

## 2019-05-23 DIAGNOSIS — H5713 Ocular pain, bilateral: Secondary | ICD-10-CM | POA: Diagnosis not present

## 2019-06-14 ENCOUNTER — Other Ambulatory Visit: Payer: Self-pay

## 2019-06-15 ENCOUNTER — Ambulatory Visit (INDEPENDENT_AMBULATORY_CARE_PROVIDER_SITE_OTHER): Payer: BC Managed Care – PPO | Admitting: Family Medicine

## 2019-06-15 ENCOUNTER — Encounter: Payer: Self-pay | Admitting: Family Medicine

## 2019-06-15 ENCOUNTER — Ambulatory Visit: Payer: BC Managed Care – PPO | Admitting: Family Medicine

## 2019-06-15 VITALS — BP 104/72 | HR 76 | Temp 97.6°F | Ht 68.0 in | Wt 137.0 lb

## 2019-06-15 DIAGNOSIS — K5901 Slow transit constipation: Secondary | ICD-10-CM | POA: Insufficient documentation

## 2019-06-15 DIAGNOSIS — E559 Vitamin D deficiency, unspecified: Secondary | ICD-10-CM | POA: Diagnosis not present

## 2019-06-15 DIAGNOSIS — Z0000 Encounter for general adult medical examination without abnormal findings: Secondary | ICD-10-CM

## 2019-06-15 DIAGNOSIS — Z8616 Personal history of COVID-19: Secondary | ICD-10-CM

## 2019-06-15 LAB — COMPREHENSIVE METABOLIC PANEL
ALT: 19 U/L (ref 0–35)
AST: 20 U/L (ref 0–37)
Albumin: 4.4 g/dL (ref 3.5–5.2)
Alkaline Phosphatase: 63 U/L (ref 39–117)
BUN: 17 mg/dL (ref 6–23)
CO2: 31 mEq/L (ref 19–32)
Calcium: 9.7 mg/dL (ref 8.4–10.5)
Chloride: 103 mEq/L (ref 96–112)
Creatinine, Ser: 0.74 mg/dL (ref 0.40–1.20)
GFR: 81.45 mL/min (ref >60.00)
Glucose, Bld: 98 mg/dL (ref 70–99)
Potassium: 4.5 mEq/L (ref 3.5–5.1)
Sodium: 139 mEq/L (ref 135–145)
Total Bilirubin: 1.2 mg/dL (ref 0.2–1.2)
Total Protein: 6.8 g/dL (ref 6.0–8.3)

## 2019-06-15 LAB — CBC
HCT: 41.6 % (ref 36.0–46.0)
Hemoglobin: 14.1 g/dL (ref 12.0–15.0)
MCHC: 33.8 g/dL (ref 30.0–36.0)
MCV: 89.7 fl (ref 78.0–100.0)
Platelets: 256 10*3/uL (ref 150.0–400.0)
RBC: 4.64 Mil/uL (ref 3.87–5.11)
RDW: 13.9 % (ref 11.5–15.5)
WBC: 3.7 10*3/uL — ABNORMAL LOW (ref 4.0–10.5)

## 2019-06-15 LAB — LIPID PANEL
Cholesterol: 257 mg/dL — ABNORMAL HIGH (ref 0–200)
HDL: 89.8 mg/dL (ref >39.00)
LDL Cholesterol: 153 mg/dL — ABNORMAL HIGH (ref 0–99)
NonHDL: 167.25
Total CHOL/HDL Ratio: 3
Triglycerides: 72 mg/dL (ref 0.0–149.0)
VLDL: 14.4 mg/dL (ref 0.0–40.0)

## 2019-06-15 LAB — VITAMIN D 25 HYDROXY (VIT D DEFICIENCY, FRACTURES): VITD: 52.44 ng/mL (ref 30.00–100.00)

## 2019-06-15 MED ORDER — DOCUSATE SODIUM 100 MG PO CAPS: 100.0000 mg | ORAL_CAPSULE | Freq: Two times a day (BID) | ORAL | 1 refills | Status: DC | PRN
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
## 2019-06-15 NOTE — Addendum Note (Signed)
Addended by: Marrion Coy on: 06/15/2019 08:53 AM   Modules accepted: Orders

## 2019-06-15 NOTE — Patient Instructions (Signed)
Health Maintenance, Female Adopting a healthy lifestyle and getting preventive care are important in promoting health and wellness. Ask your health care provider about:  The right schedule for you to have regular tests and exams.  Things you can do on your own to prevent diseases and keep yourself healthy. What should I know about diet, weight, and exercise? Eat a healthy diet   Eat a diet that includes plenty of vegetables, fruits, low-fat dairy products, and lean protein.  Do not eat a lot of foods that are high in solid fats, added sugars, or sodium. Maintain a healthy weight Body mass index (BMI) is used to identify weight problems. It estimates body fat based on height and weight. Your health care provider can help determine your BMI and help you achieve or maintain a healthy weight. Get regular exercise Get regular exercise. This is one of the most important things you can do for your health. Most adults should:  Exercise for at least 150 minutes each week. The exercise should increase your heart rate and make you sweat (moderate-intensity exercise).  Do strengthening exercises at least twice a week. This is in addition to the moderate-intensity exercise.  Spend less time sitting. Even light physical activity can be beneficial. Watch cholesterol and blood lipids Have your blood tested for lipids and cholesterol at 55 years of age, then have this test every 5 years. Have your cholesterol levels checked more often if:  Your lipid or cholesterol levels are high.  You are older than 55 years of age.  You are at high risk for heart disease. What should I know about cancer screening? Depending on your health history and family history, you may need to have cancer screening at various ages. This may include screening for:  Breast cancer.  Cervical cancer.  Colorectal cancer.  Skin cancer.  Lung cancer. What should I know about heart disease, diabetes, and high blood  pressure? Blood pressure and heart disease  High blood pressure causes heart disease and increases the risk of stroke. This is more likely to develop in people who have high blood pressure readings, are of African descent, or are overweight.  Have your blood pressure checked: ? Every 3-5 years if you are 54-62 years of age. ? Every year if you are 93 years old or older. Diabetes Have regular diabetes screenings. This checks your fasting blood sugar level. Have the screening done:  Once every three years after age 69 if you are at a normal weight and have a low risk for diabetes.  More often and at a younger age if you are overweight or have a high risk for diabetes. What should I know about preventing infection? Hepatitis B If you have a higher risk for hepatitis B, you should be screened for this virus. Talk with your health care provider to find out if you are at risk for hepatitis B infection. Hepatitis C Testing is recommended for:  Everyone born from [birth year] through 1966.  Anyone with known risk factors for hepatitis C. Sexually transmitted infections (STIs)  Get screened for STIs, including gonorrhea and chlamydia, if: ? You are sexually active and are younger than 55 years of age. ? You are older than 55 years of age and your health care provider tells you that you are at risk for this type of infection. ? Your sexual activity has changed since you were last screened, and you are at increased risk for chlamydia or gonorrhea. Ask your health care provider if  you are at risk. °· Ask your health care provider about whether you are at high risk for HIV. Your health care provider may recommend a prescription medicine to help prevent HIV infection. If you choose to take medicine to prevent HIV, you should first get tested for HIV. You should then be tested every 3 months for as long as you are taking the medicine. °Pregnancy °· If you are about to stop having your period (premenopausal) and  you may become pregnant, seek counseling before you get pregnant. °· Take 400 to 800 micrograms (mcg) of folic acid every day if you become pregnant. °· Ask for birth control (contraception) if you want to prevent pregnancy. °Osteoporosis and menopause °Osteoporosis is a disease in which the bones lose minerals and strength with aging. This can result in bone fractures. If you are 65 years old or older, or if you are at risk for osteoporosis and fractures, ask your health care provider if you should: °· Be screened for bone loss. °· Take a calcium or vitamin D supplement to lower your risk of fractures. °· Be given hormone replacement therapy (HRT) to treat symptoms of menopause. °Follow these instructions at home: °Lifestyle °· Do not use any products that contain nicotine or tobacco, such as cigarettes, e-cigarettes, and chewing tobacco. If you need help quitting, ask your health care provider. °· Do not use street drugs. °· Do not share needles. °· Ask your health care provider for help if you need support or information about quitting drugs. °Alcohol use °· Do not drink alcohol if: °? Your health care provider tells you not to drink. °? You are pregnant, may be pregnant, or are planning to become pregnant. °· If you drink alcohol: °? Limit how much you use to 0-1 drink a day. °? Limit intake if you are breastfeeding. °· Be aware of how much alcohol is in your drink. In the [place] one drink equals one 12 oz bottle of beer (355 mL), one 5 oz glass of wine (148 mL), or one 1½ oz glass of hard liquor (44 mL). °General instructions °· Schedule regular health, dental, and eye exams. °· Stay current with your vaccines. °· Tell your health care provider if: °? You often feel depressed. °? You have ever been abused or do not feel safe at home. °Summary °· Adopting a healthy lifestyle and getting preventive care are important in promoting health and wellness. °· Follow your health care provider's instructions about healthy  diet, exercising, and getting tested or screened for diseases. °· Follow your health care provider's instructions on monitoring your cholesterol and blood pressure. °This information is not intended to replace advice given to you by your health care provider. Make sure you discuss any questions you have with your health care provider. °Document Revised: 03/23/2018 Document Reviewed: 03/23/2018 °Elsevier Patient Education © 2020 Else[employer] Inc[employer] ° °Preventive Care 40-64 Years Old, Female °Preventive care refers to visits with your health care provider and lifestyle choices that can promote health and wellness. This includes: °· A yearly physical exam. This may also be called an annual well check. °· Regular dental visits and eye exams. °· Immunizations. °· Screening for certain conditions. °· Healthy lifestyle choices, such as eating a healthy diet, getting regular exercise, not using drugs or products that contain nicotine and tobacco, and limiting alcohol use. °What can I expect for my preventive care visit? °Physical exam °Your health care provider will check your: °· Height and weight. This may be used   to calculate body mass index (BMI), which tells if you are at a healthy weight. °· Heart rate and blood pressure. °· Skin for abnormal spots. °Counseling °Your health care provider may ask you questions about your: °· Alcohol, tobacco, and drug use. °· Emotional well-being. °· Home and relationship well-being. °· Sexual activity. °· Eating habits. °· Work and work environment. °· Method of birth control. °· Menstrual cycle. °· Pregnancy history. °What immunizations do I need? ° °Influenza (flu) vaccine °· This is recommended every year. °Tetanus, diphtheria, and pertussis (Tdap) vaccine °· You may need a Td booster every 10 years. °Varicella (chickenpox) vaccine °· You may need this if you have not been vaccinated. °Zoster (shingles) vaccine °· You may need this after age 60. °Measles, mumps, and rubella (MMR)  vaccine °· You may need at least one dose of MMR if you were born in 1957 or later. You may also need a second dose. °Pneumococcal conjugate (PCV13) vaccine °· You may need this if you have certain conditions and were not previously vaccinated. °Pneumococcal polysaccharide (PPSV23) vaccine °· You may need one or two doses if you smoke cigarettes or if you have certain conditions. °Meningococcal conjugate (MenACWY) vaccine °· You may need this if you have certain conditions. °Hepatitis A vaccine °· You may need this if you have certain conditions or if you travel or work in places where you may be exposed to hepatitis A. °Hepatitis B vaccine °· You may need this if you have certain conditions or if you travel or work in places where you may be exposed to hepatitis B. °Haemophilus influenzae type b (Hib) vaccine °· You may need this if you have certain conditions. °Human papillomavirus (HPV) vaccine °· If recommended by your health care provider, you may need three doses over 6 months. °You may receive vaccines as individual doses or as more than one vaccine together in one shot (combination vaccines). Talk with your health care provider about the risks and benefits of combination vaccines. °What tests do I need? °Blood tests °· Lipid and cholesterol levels. These may be checked every 5 years, or more frequently if you are over 50 years old. °· Hepatitis C test. °· Hepatitis B test. °Screening °· Lung cancer screening. You may have this screening every year starting at age 55 if you have a 30-pack-year history of smoking and currently smoke or have quit within the past 15 years. °· Colorectal cancer screening. All adults should have this screening starting at age 50 and continuing until age 75. Your health care provider may recommend screening at age 45 if you are at increased risk. You will have tests every 1-10 years, depending on your results and the type of screening test. °· Diabetes screening. This is done by  checking your blood sugar (glucose) after you have not eaten for a while (fasting). You may have this done every 1-3 years. °· Mammogram. This may be done every 1-2 years. Talk with your health care provider about when you should start having regular mammograms. This may depend on whether you have a family history of breast cancer. °· BRCA-related cancer screening. This may be done if you have a family history of breast, ovarian, tubal, or peritoneal cancers. °· Pelvic exam and Pap test. This may be done every 3 years starting at age 21. Starting at age 30, this may be done every 5 years if you have a Pap test in combination with an HPV test. °Other tests °· Sexually transmitted disease (  STD) testing.  Bone density scan. This is done to screen for osteoporosis. You may have this scan if you are at high risk for osteoporosis. Follow these instructions at home: Eating and drinking  Eat a diet that includes fresh fruits and vegetables, whole grains, lean protein, and low-fat dairy.  Take vitamin and mineral supplements as recommended by your health care provider.  Do not drink alcohol if: ? Your health care provider tells you not to drink. ? You are pregnant, may be pregnant, or are planning to become pregnant.  If you drink alcohol: ? Limit how much you have to 0-1 drink a day. ? Be aware of how much alcohol is in your drink. In the [place] one drink equals one 12 oz bottle of beer (355 mL), one 5 oz glass of wine (148 mL), or one 1 oz glass of hard liquor (44 mL). Lifestyle  Take daily care of your teeth and gums.  Stay active. Exercise for at least 30 minutes on 5 or more days each week.  Do not use any products that contain nicotine or tobacco, such as cigarettes, e-cigarettes, and chewing tobacco. If you need help quitting, ask your health care provider.  If you are sexually active, practice safe sex. Use a condom or other form of birth control (contraception) in order to prevent pregnancy  and STIs (sexually transmitted infections).  If told by your health care provider, take low-dose aspirin daily starting at age 40. What's next?  Visit your health care provider once a year for a well check visit.  Ask your health care provider how often you should have your eyes and teeth checked.  Stay up to date on all vaccines. This information is not intended to replace advice given to you by your health care provider. Make sure you discuss any questions you have with your health care provider. Document Revised: 12/09/2017 Document Reviewed: 12/09/2017 Elsevier Patient Education  [place].  Constipation, Adult Constipation is when a person has fewer bowel movements in a week than normal, has difficulty having a bowel movement, or has stools that are dry, hard, or larger than normal. Constipation may be caused by an underlying condition. It may become worse with age if a person takes certain medicines and does not take in enough fluids. Follow these instructions at home: Eating and drinking   Eat foods that have a lot of fiber, such as fresh fruits and vegetables, whole grains, and beans.  Limit foods that are high in fat, low in fiber, or overly processed, such as french fries, hamburgers, cookies, candies, and soda.  Drink enough fluid to keep your urine clear or pale yellow. General instructions  Exercise regularly or as told by your health care provider.  Go to the restroom when you have the urge to go. Do not hold it in.  Take over-the-counter and prescription medicines only as told by your health care provider. These include any fiber supplements.  Practice pelvic floor retraining exercises, such as deep breathing while relaxing the lower abdomen and pelvic floor relaxation during bowel movements.  Watch your condition for any changes.  Keep all follow-up visits as told by your health care provider. This is important. Contact a health care provider if:  You  have pain that gets worse.  You have a fever.  You do not have a bowel movement after 4 days.  You vomit.  You are not hungry.  You lose weight.  You are bleeding from the anus.  You have thin, pencil-like stools. Get help right away if:  You have a fever and your symptoms suddenly get worse.  You leak stool or have blood in your stool.  Your abdomen is bloated.  You have severe pain in your abdomen.  You feel dizzy or you faint. This information is not intended to replace advice given to you by your health care provider. Make sure you discuss any questions you have with your health care provider. Document Revised: 03/12/2017 Document Reviewed: 09/18/2015 Elsevier Patient Education  2020 [employer].

## 2019-06-16 LAB — HIV ANTIBODY (ROUTINE TESTING W REFLEX): HIV 1&2 Ab, 4th Generation: NONREACTIVE

## 2019-06-16 LAB — SAR COV2 SEROLOGY (COVID19)AB(IGG),IA: SARS CoV2 AB IGG: POSITIVE — AB

## 2019-08-11 ENCOUNTER — Ambulatory Visit: Payer: BC Managed Care – PPO | Attending: Internal Medicine

## 2019-08-11 DIAGNOSIS — Z23 Encounter for immunization: Secondary | ICD-10-CM

## 2019-08-11 NOTE — Progress Notes (Signed)
   Covid-19 Vaccination Clinic  Name:  CORINNA MARBRY    MRN: [id number] DOB: Jul 06, 1965  08/11/2019  Ms. Lindt was observed post Covid-19 immunization for 15 minutes without incident. She was provided with Vaccine Information Sheet and instruction to access the V-Safe system.   Ms. Keith was instructed to call 911 with any severe reactions post vaccine: Marland Kitchen Difficulty breathing  . Swelling of face and throat  . A fast heartbeat  . A bad rash all over body  . Dizziness and weakness   Immunizations Administered    Name Date Dose VIS Date Route   Pfizer COVID-19 Vaccine 08/11/2019  4:27 PM 0.3 mL 06/07/2018 Intramuscular   Manufacturer: [place]   Lot: [id number]   [place]: [id number]

## 2019-09-04 ENCOUNTER — Ambulatory Visit: Payer: BC Managed Care – PPO

## 2019-09-05 ENCOUNTER — Encounter: Payer: Self-pay | Admitting: Family Medicine

## 2019-09-08 ENCOUNTER — Other Ambulatory Visit: Payer: Self-pay | Admitting: Obstetrics and Gynecology

## 2019-09-08 DIAGNOSIS — Z1231 Encounter for screening mammogram for malignant neoplasm of breast: Secondary | ICD-10-CM

## 2019-09-13 DIAGNOSIS — Z6822 Body mass index (BMI) 22.0-22.9, adult: Secondary | ICD-10-CM | POA: Diagnosis not present

## 2019-09-13 DIAGNOSIS — Z01419 Encounter for gynecological examination (general) (routine) without abnormal findings: Secondary | ICD-10-CM | POA: Diagnosis not present

## 2019-10-12 ENCOUNTER — Ambulatory Visit: Payer: BC Managed Care – PPO

## 2019-10-17 ENCOUNTER — Other Ambulatory Visit: Payer: Self-pay

## 2019-10-17 ENCOUNTER — Ambulatory Visit
Admission: RE | Admit: 2019-10-17 | Discharge: 2019-10-17 | Disposition: A | Discharge status: Home / Self Care | Payer: BC Managed Care – PPO | Source: Ambulatory Visit | Attending: Obstetrics and Gynecology | Admitting: Obstetrics and Gynecology

## 2019-10-17 DIAGNOSIS — Z1231 Encounter for screening mammogram for malignant neoplasm of breast: Secondary | ICD-10-CM | POA: Diagnosis not present

## 2020-03-01 ENCOUNTER — Encounter: Payer: Self-pay | Admitting: Family Medicine

## 2020-06-17 ENCOUNTER — Ambulatory Visit (INDEPENDENT_AMBULATORY_CARE_PROVIDER_SITE_OTHER): Payer: BC Managed Care – PPO | Admitting: Family Medicine

## 2020-06-17 ENCOUNTER — Other Ambulatory Visit: Payer: Self-pay

## 2020-06-17 ENCOUNTER — Encounter: Payer: Self-pay | Admitting: Family Medicine

## 2020-06-17 VITALS — BP 102/66 | HR 67 | Temp 98.0°F | Ht 68.0 in | Wt 147.0 lb

## 2020-06-17 DIAGNOSIS — Z0000 Encounter for general adult medical examination without abnormal findings: Secondary | ICD-10-CM

## 2020-06-17 DIAGNOSIS — E559 Vitamin D deficiency, unspecified: Secondary | ICD-10-CM

## 2020-06-17 LAB — URINALYSIS, ROUTINE W REFLEX MICROSCOPIC
Bilirubin Urine: NEGATIVE
Hgb urine dipstick: NEGATIVE
Leukocytes,Ua: NEGATIVE
Nitrite: NEGATIVE
Specific Gravity, Urine: 1.025 (ref 1.000–1.030)
Total Protein, Urine: NEGATIVE
Urine Glucose: NEGATIVE
Urobilinogen, UA: 0.2 (ref 0.0–1.0)
pH: 6 (ref 5.0–8.0)

## 2020-06-17 LAB — COMPREHENSIVE METABOLIC PANEL
ALT: 15 U/L (ref 0–35)
AST: 19 U/L (ref 0–37)
Albumin: 4.4 g/dL (ref 3.5–5.2)
Alkaline Phosphatase: 65 U/L (ref 39–117)
BUN: 15 mg/dL (ref 6–23)
CO2: 32 mEq/L (ref 19–32)
Calcium: 9.6 mg/dL (ref 8.4–10.5)
Chloride: 105 mEq/L (ref 96–112)
Creatinine, Ser: 0.79 mg/dL (ref 0.40–1.20)
GFR: 83.8 mL/min (ref >60.00)
Glucose, Bld: 99 mg/dL (ref 70–99)
Potassium: 4.2 mEq/L (ref 3.5–5.1)
Sodium: 142 mEq/L (ref 135–145)
Total Bilirubin: 1.4 mg/dL — ABNORMAL HIGH (ref 0.2–1.2)
Total Protein: 6.7 g/dL (ref 6.0–8.3)

## 2020-06-17 LAB — LIPID PANEL
Cholesterol: 226 mg/dL — ABNORMAL HIGH (ref 0–200)
HDL: 84.2 mg/dL (ref >39.00)
LDL Cholesterol: 131 mg/dL — ABNORMAL HIGH (ref 0–99)
NonHDL: 141.62
Total CHOL/HDL Ratio: 3
Triglycerides: 55 mg/dL (ref 0.0–149.0)
VLDL: 11 mg/dL (ref 0.0–40.0)

## 2020-06-17 LAB — CBC
HCT: 40.7 % (ref 36.0–46.0)
Hemoglobin: 13.7 g/dL (ref 12.0–15.0)
MCHC: 33.6 g/dL (ref 30.0–36.0)
MCV: 87.3 fl (ref 78.0–100.0)
Platelets: 245 10*3/uL (ref 150.0–400.0)
RBC: 4.66 Mil/uL (ref 3.87–5.11)
RDW: 14.5 % (ref 11.5–15.5)
WBC: 3.4 10*3/uL — ABNORMAL LOW (ref 4.0–10.5)

## 2020-06-17 LAB — VITAMIN D 25 HYDROXY (VIT D DEFICIENCY, FRACTURES): VITD: 44.23 ng/mL (ref 30.00–100.00)
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
## 2020-06-17 NOTE — Patient Instructions (Signed)
Health Maintenance, Female Adopting a healthy lifestyle and getting preventive care are important in promoting health and wellness. Ask your health care provider about:  The right schedule for you to have regular tests and exams.  Things you can do on your own to prevent diseases and keep yourself healthy. What should I know about diet, weight, and exercise? Eat a healthy diet  Eat a diet that includes plenty of vegetables, fruits, low-fat dairy products, and lean protein.  Do not eat a lot of foods that are high in solid fats, added sugars, or sodium.   Maintain a healthy weight Body mass index (BMI) is used to identify weight problems. It estimates body fat based on height and weight. Your health care provider can help determine your BMI and help you achieve or maintain a healthy weight. Get regular exercise Get regular exercise. This is one of the most important things you can do for your health. Most adults should:  Exercise for at least 150 minutes each week. The exercise should increase your heart rate and make you sweat (moderate-intensity exercise).  Do strengthening exercises at least twice a week. This is in addition to the moderate-intensity exercise.  Spend less time sitting. Even light physical activity can be beneficial. Watch cholesterol and blood lipids Have your blood tested for lipids and cholesterol at 56 years of age, then have this test every 5 years. Have your cholesterol levels checked more often if:  Your lipid or cholesterol levels are high.  You are older than 56 years of age.  You are at high risk for heart disease. What should I know about cancer screening? Depending on your health history and family history, you may need to have cancer screening at various ages. This may include screening for:  Breast cancer.  Cervical cancer.  Colorectal cancer.  Skin cancer.  Lung cancer. What should I know about heart disease, diabetes, and high blood  pressure? Blood pressure and heart disease  High blood pressure causes heart disease and increases the risk of stroke. This is more likely to develop in people who have high blood pressure readings, are of African descent, or are overweight.  Have your blood pressure checked: ? Every 3-5 years if you are 56-39 years of age. ? Every year if you are 40 years old or older. Diabetes Have regular diabetes screenings. This checks your fasting blood sugar level. Have the screening done:  Once every three years after age 56 if you are at a normal weight and have a low risk for diabetes.  More often and at a younger age if you are overweight or have a high risk for diabetes. What should I know about preventing infection? Hepatitis B If you have a higher risk for hepatitis B, you should be screened for this virus. Talk with your health care provider to find out if you are at risk for hepatitis B infection. Hepatitis C Testing is recommended for:  Everyone born from 1945 through 1965.  Anyone with known risk factors for hepatitis C. Sexually transmitted infections (STIs)  Get screened for STIs, including gonorrhea and chlamydia, if: ? You are sexually active and are younger than 56 years of age. ? You are older than 56 years of age and your health care provider tells you that you are at risk for this type of infection. ? Your sexual activity has changed since you were last screened, and you are at increased risk for chlamydia or gonorrhea. Ask your health care provider   if you are at risk.  Ask your health care provider about whether you are at high risk for HIV. Your health care provider may recommend a prescription medicine to help prevent HIV infection. If you choose to take medicine to prevent HIV, you should first get tested for HIV. You should then be tested every 3 months for as long as you are taking the medicine. Pregnancy  If you are about to stop having your period (premenopausal) and  you may become pregnant, seek counseling before you get pregnant.  Take 400 to 800 micrograms (mcg) of folic acid every day if you become pregnant.  Ask for birth control (contraception) if you want to prevent pregnancy. Osteoporosis and menopause Osteoporosis is a disease in which the bones lose minerals and strength with aging. This can result in bone fractures. If you are 1 years old or older, or if you are at risk for osteoporosis and fractures, ask your health care provider if you should:  Be screened for bone loss.  Take a calcium or vitamin D supplement to lower your risk of fractures.  Be given hormone replacement therapy (HRT) to treat symptoms of menopause. Follow these instructions at home: Lifestyle  Do not use any products that contain nicotine or tobacco, such as cigarettes, e-cigarettes, and chewing tobacco. If you need help quitting, ask your health care provider.  Do not use street drugs.  Do not share needles.  Ask your health care provider for help if you need support or information about quitting drugs. Alcohol use  Do not drink alcohol if: ? Your health care provider tells you not to drink. ? You are pregnant, may be pregnant, or are planning to become pregnant.  If you drink alcohol: ? Limit how much you use to 0-1 drink a day. ? Limit intake if you are breastfeeding.  Be aware of how much alcohol is in your drink. In the [place] one drink equals one 12 oz bottle of beer (355 mL), one 5 oz glass of wine (148 mL), or one 1 oz glass of hard liquor (44 mL). General instructions  Schedule regular health, dental, and eye exams.  Stay current with your vaccines.  Tell your health care provider if: ? You often feel depressed. ? You have ever been abused or do not feel safe at home. Summary  Adopting a healthy lifestyle and getting preventive care are important in promoting health and wellness.  Follow your health care provider's instructions about healthy  diet, exercising, and getting tested or screened for diseases.  Follow your health care provider's instructions on monitoring your cholesterol and blood pressure. This information is not intended to replace advice given to you by your health care provider. Make sure you discuss any questions you have with your health care provider. Document Revised: 03/23/2018 Document Reviewed: 03/23/2018 Elsevier Patient Education  2021 [place].  Preventing High Cholesterol Cholesterol is a white, waxy substance similar to fat that the human body needs to help build cells. The liver makes all the cholesterol that a person's body needs. Having high cholesterol (hypercholesterolemia) increases your risk for heart disease and stroke. Extra or excess cholesterol comes from the food that you eat. High cholesterol can often be prevented with diet and lifestyle changes. If you already have high cholesterol, you can control it with diet, lifestyle changes, and medicines. How can high cholesterol affect me? If you have high cholesterol, fatty deposits (plaques) may build up on the walls of your blood vessels. The  blood vessels that carry blood away from your heart are called arteries. Plaques make the arteries narrower and stiffer. This in turn can:  Restrict or block blood flow and cause blood clots to form.  Increase your risk for heart attack and stroke. What can increase my risk for high cholesterol? This condition is more likely to develop in people who:  Eat foods that are high in saturated fat or cholesterol. Saturated fat is mostly found in foods that come from animal sources.  Are overweight.  Are not getting enough exercise.  Have a family history of high cholesterol (familial hypercholesterolemia). What actions can I take to prevent this? Nutrition  Eat less saturated fat.  Avoid trans fats (partially hydrogenated oils). These are often found in margarine and in some baked goods, fried foods,  and snacks bought in packages.  Avoid precooked or cured meat, such as bacon, sausages, or meat loaves.  Avoid foods and drinks that have added sugars.  Eat more fruits, vegetables, and whole grains.  Choose healthy sources of protein, such as fish, poultry, lean cuts of red meat, beans, peas, lentils, and nuts.  Choose healthy sources of fat, such as: ? Nuts. ? Vegetable oils, especially olive oil. ? Fish that have healthy fats, such as omega-3 fatty acids. These fish include mackerel or salmon.   Lifestyle  Lose weight if you are overweight. Maintaining a healthy body mass index (BMI) can help prevent or control high cholesterol. It can also lower your risk for diabetes and high blood pressure. Ask your health care provider to help you with a diet and exercise plan to lose weight safely.  Do not use any products that contain nicotine or tobacco, such as cigarettes, e-cigarettes, and chewing tobacco. If you need help quitting, ask your health care provider. Alcohol use  Do not drink alcohol if: ? Your health care provider tells you not to drink. ? You are pregnant, may be pregnant, or are planning to become pregnant.  If you drink alcohol: ? Limit how much you use to:  0-1 drink a day for women.  0-2 drinks a day for men. ? Be aware of how much alcohol is in your drink. In the [place] one drink equals one 12 oz bottle of beer (355 mL), one 5 oz glass of wine (148 mL), or one 1 oz glass of hard liquor (44 mL). Activity  Get enough exercise. Do exercises as told by your health care provider.  Each week, do at least 150 minutes of exercise that takes a medium level of effort (moderate-intensity exercise). This kind of exercise: ? Makes your heart beat faster while allowing you to still be able to talk. ? Can be done in short sessions several times a day or longer sessions a few times a week. For example, on 5 days each week, you could walk fast or ride your bike 3 times a day for  10 minutes each time.   Medicines  Your health care provider may recommend medicines to help lower cholesterol. This may be a medicine to lower the amount of cholesterol that your liver makes. You may need medicine if: ? Diet and lifestyle changes have not lowered your cholesterol enough. ? You have high cholesterol and other risk factors for heart disease or stroke.  Take over-the-counter and prescription medicines only as told by your health care provider. General information  Manage your risk factors for high cholesterol. Talk with your health care provider about all your risk factors  and how to lower your risk.  Manage other conditions that you have, such as diabetes or high blood pressure (hypertension).  Have blood tests to check your cholesterol levels at regular points in time as told by your health care provider.  Keep all follow-up visits as told by your health care provider. This is important. Where to find more information  American Heart Association: www.heart.org  National Heart, Lung, and Blood Institute: https://wilson-eaton.com/ Summary  High cholesterol increases your risk for heart disease and stroke. By keeping your cholesterol level low, you can reduce your risk for these conditions.  High cholesterol can often be prevented with diet and lifestyle changes.  Work with your health care provider to manage your risk factors, and have your blood tested regularly. This information is not intended to replace advice given to you by your health care provider. Make sure you discuss any questions you have with your health care provider. Document Revised: 01/10/2019 Document Reviewed: 01/10/2019 Elsevier Patient Education  [place].

## 2020-08-28 ENCOUNTER — Other Ambulatory Visit: Payer: Self-pay | Admitting: Obstetrics and Gynecology

## 2020-08-28 DIAGNOSIS — Z1231 Encounter for screening mammogram for malignant neoplasm of breast: Secondary | ICD-10-CM

## 2020-09-12 ENCOUNTER — Encounter: Payer: Self-pay | Admitting: Family Medicine

## 2020-10-03 LAB — HM DEXA SCAN

## 2020-10-30 ENCOUNTER — Other Ambulatory Visit: Payer: Self-pay

## 2020-10-30 ENCOUNTER — Ambulatory Visit
Admission: RE | Admit: 2020-10-30 | Discharge: 2020-10-30 | Disposition: A | Discharge status: Home / Self Care | Payer: BC Managed Care – PPO | Source: Ambulatory Visit | Attending: Obstetrics and Gynecology | Admitting: Obstetrics and Gynecology

## 2020-10-30 DIAGNOSIS — Z1231 Encounter for screening mammogram for malignant neoplasm of breast: Secondary | ICD-10-CM
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
## 2021-06-16 ENCOUNTER — Encounter: Payer: Self-pay | Admitting: Family Medicine

## 2021-06-17 ENCOUNTER — Other Ambulatory Visit: Payer: Self-pay

## 2021-06-19 ENCOUNTER — Ambulatory Visit (INDEPENDENT_AMBULATORY_CARE_PROVIDER_SITE_OTHER): Payer: BC Managed Care – PPO | Admitting: Family Medicine

## 2021-06-19 ENCOUNTER — Encounter: Payer: Self-pay | Admitting: Family Medicine

## 2021-06-19 VITALS — BP 104/66 | HR 73 | Temp 97.4°F | Ht 68.0 in | Wt 148.8 lb

## 2021-06-19 DIAGNOSIS — M19079 Primary osteoarthritis, unspecified ankle and foot: Secondary | ICD-10-CM | POA: Diagnosis not present

## 2021-06-19 DIAGNOSIS — D709 Neutropenia, unspecified: Secondary | ICD-10-CM

## 2021-06-19 DIAGNOSIS — Z0000 Encounter for general adult medical examination without abnormal findings: Secondary | ICD-10-CM

## 2021-06-19 LAB — LIPID PANEL
Cholesterol: 241 mg/dL — ABNORMAL HIGH (ref 0–200)
HDL: 83.8 mg/dL (ref >39.00)
LDL Cholesterol: 144 mg/dL — ABNORMAL HIGH (ref 0–99)
NonHDL: 156.76
Total CHOL/HDL Ratio: 3
Triglycerides: 66 mg/dL (ref 0.0–149.0)
VLDL: 13.2 mg/dL (ref 0.0–40.0)

## 2021-06-19 LAB — URINALYSIS, ROUTINE W REFLEX MICROSCOPIC
Bilirubin Urine: NEGATIVE
Ketones, ur: NEGATIVE
Nitrite: NEGATIVE
Specific Gravity, Urine: 1.02 (ref 1.000–1.030)
Total Protein, Urine: NEGATIVE
Urine Glucose: NEGATIVE
Urobilinogen, UA: 0.2 (ref 0.0–1.0)
pH: 6 (ref 5.0–8.0)

## 2021-06-19 LAB — COMPREHENSIVE METABOLIC PANEL
ALT: 17 U/L (ref 0–35)
AST: 19 U/L (ref 0–37)
Albumin: 4.6 g/dL (ref 3.5–5.2)
Alkaline Phosphatase: 62 U/L (ref 39–117)
BUN: 14 mg/dL (ref 6–23)
CO2: 30 mEq/L (ref 19–32)
Calcium: 9.5 mg/dL (ref 8.4–10.5)
Chloride: 103 mEq/L (ref 96–112)
Creatinine, Ser: 0.77 mg/dL (ref 0.40–1.20)
GFR: 85.81 mL/min (ref >60.00)
Glucose, Bld: 95 mg/dL (ref 70–99)
Potassium: 4.2 mEq/L (ref 3.5–5.1)
Sodium: 140 mEq/L (ref 135–145)
Total Bilirubin: 1.5 mg/dL — ABNORMAL HIGH (ref 0.2–1.2)
Total Protein: 6.9 g/dL (ref 6.0–8.3)

## 2021-06-19 LAB — CBC WITH DIFFERENTIAL/PLATELET
Basophils Absolute: 0 10*3/uL (ref 0.0–0.1)
Basophils Relative: 0.5 % (ref 0.0–3.0)
Eosinophils Absolute: 0.1 10*3/uL (ref 0.0–0.7)
Eosinophils Relative: 3 % (ref 0.0–5.0)
HCT: 41.1 % (ref 36.0–46.0)
Hemoglobin: 13.8 g/dL (ref 12.0–15.0)
Lymphocytes Relative: 41.2 % (ref 12.0–46.0)
Lymphs Abs: 1.7 10*3/uL (ref 0.7–4.0)
MCHC: 33.6 g/dL (ref 30.0–36.0)
MCV: 89.6 fl (ref 78.0–100.0)
Monocytes Absolute: 0.5 10*3/uL (ref 0.1–1.0)
Monocytes Relative: 11 % (ref 3.0–12.0)
Neutro Abs: 1.9 10*3/uL (ref 1.4–7.7)
Neutrophils Relative %: 44.3 % (ref 43.0–77.0)
Platelets: 242 10*3/uL (ref 150.0–400.0)
RBC: 4.59 Mil/uL (ref 3.87–5.11)
RDW: 13.6 % (ref 11.5–15.5)
WBC: 4.2 10*3/uL (ref 4.0–10.5)

## 2021-06-19 LAB — TSH: TSH: 1.01 u[IU]/mL (ref 0.35–5.50)
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
## 2021-07-25 ENCOUNTER — Encounter: Payer: Self-pay | Admitting: Family Medicine

## 2021-10-21 ENCOUNTER — Other Ambulatory Visit: Payer: Self-pay | Admitting: Obstetrics and Gynecology

## 2021-10-21 DIAGNOSIS — Z1231 Encounter for screening mammogram for malignant neoplasm of breast: Secondary | ICD-10-CM

## 2021-10-31 ENCOUNTER — Ambulatory Visit
Admission: RE | Admit: 2021-10-31 | Discharge: 2021-10-31 | Disposition: A | Discharge status: Home / Self Care | Payer: BC Managed Care – PPO | Source: Ambulatory Visit | Attending: Obstetrics and Gynecology | Admitting: Obstetrics and Gynecology

## 2021-10-31 DIAGNOSIS — Z1231 Encounter for screening mammogram for malignant neoplasm of breast: Secondary | ICD-10-CM

## 2021-11-08 ENCOUNTER — Emergency Department (HOSPITAL_BASED_OUTPATIENT_CLINIC_OR_DEPARTMENT_OTHER)
Admission: EM | Admit: 2021-11-08 | Discharge: 2021-11-08 | Disposition: A | Discharge status: Home / Self Care | Payer: BC Managed Care – PPO | Attending: Emergency Medicine | Admitting: Emergency Medicine

## 2021-11-08 ENCOUNTER — Emergency Department (HOSPITAL_BASED_OUTPATIENT_CLINIC_OR_DEPARTMENT_OTHER): Payer: BC Managed Care – PPO

## 2021-11-08 ENCOUNTER — Encounter (HOSPITAL_BASED_OUTPATIENT_CLINIC_OR_DEPARTMENT_OTHER): Payer: Self-pay | Admitting: Emergency Medicine

## 2021-11-08 ENCOUNTER — Other Ambulatory Visit: Payer: Self-pay

## 2021-11-08 DIAGNOSIS — S70311A Abrasion, right thigh, initial encounter: Secondary | ICD-10-CM | POA: Insufficient documentation

## 2021-11-08 DIAGNOSIS — S32591A Other specified fracture of right pubis, initial encounter for closed fracture: Secondary | ICD-10-CM

## 2021-11-08 DIAGNOSIS — S32501A Unspecified fracture of right pubis, initial encounter for closed fracture: Secondary | ICD-10-CM | POA: Diagnosis not present

## 2021-11-08 DIAGNOSIS — S3994XA Unspecified injury of external genitals, initial encounter: Secondary | ICD-10-CM | POA: Diagnosis present

## 2021-11-08 DIAGNOSIS — Y92410 Unspecified street and highway as the place of occurrence of the external cause: Secondary | ICD-10-CM | POA: Diagnosis not present

## 2021-11-08 MED ORDER — FENTANYL CITRATE PF 50 MCG/ML IJ SOSY
50.0000 ug | PREFILLED_SYRINGE | Freq: Once | INTRAMUSCULAR | Status: DC
  Filled 2021-11-08: qty 1

## 2021-11-08 MED ORDER — HYDROCODONE-ACETAMINOPHEN 5-325 MG PO TABS: 1.0000 | ORAL_TABLET | ORAL | 0 refills | Status: AC | PRN

## 2021-11-08 MED ORDER — MELOXICAM 15 MG PO TABS: 15.0000 mg | ORAL_TABLET | Freq: Every day | ORAL | 0 refills | Status: DC

## 2021-11-08 MED ORDER — FENTANYL CITRATE PF 50 MCG/ML IJ SOSY
50.0000 ug | PREFILLED_SYRINGE | Freq: Once | INTRAMUSCULAR | Status: AC
  Administered 2021-11-08: 50 ug via INTRAMUSCULAR

## 2021-11-08 NOTE — ED Notes (Signed)
ED Provider at bedside. 

## 2021-11-08 NOTE — ED Notes (Signed)
Spoke with Georgina Pillion at [facility] for consult
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
## 2021-11-08 NOTE — ED Notes (Signed)
Pt discharged to home. Discharge instructions have been discussed with patient and/or family members. Pt verbally acknowledges understanding d/c instructions, and endorses comprehension to checkout at registration before leaving.  °
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
## 2021-11-21 ENCOUNTER — Ambulatory Visit
Admission: RE | Admit: 2021-11-21 | Discharge: 2021-11-21 | Disposition: A | Discharge status: Home / Self Care | Payer: BC Managed Care – PPO | Source: Ambulatory Visit | Attending: Orthopaedic Surgery | Admitting: Orthopaedic Surgery

## 2021-11-21 ENCOUNTER — Other Ambulatory Visit: Payer: Self-pay | Admitting: Orthopaedic Surgery

## 2021-11-21 DIAGNOSIS — S32591A Other specified fracture of right pubis, initial encounter for closed fracture: Secondary | ICD-10-CM

## 2021-12-30 ENCOUNTER — Ambulatory Visit: Payer: BC Managed Care – PPO | Admitting: Physical Therapy

## 2022-06-22 ENCOUNTER — Encounter: Payer: Self-pay | Admitting: Family Medicine

## 2022-06-22 ENCOUNTER — Ambulatory Visit (INDEPENDENT_AMBULATORY_CARE_PROVIDER_SITE_OTHER): Payer: BC Managed Care – PPO | Admitting: Family Medicine

## 2022-06-22 VITALS — BP 110/70 | HR 79 | Temp 98.4°F | Ht 68.5 in | Wt 156.8 lb

## 2022-06-22 DIAGNOSIS — Z780 Asymptomatic menopausal state: Secondary | ICD-10-CM | POA: Diagnosis not present

## 2022-06-22 DIAGNOSIS — Z8781 Personal history of (healed) traumatic fracture: Secondary | ICD-10-CM | POA: Diagnosis not present

## 2022-06-22 DIAGNOSIS — Z0000 Encounter for general adult medical examination without abnormal findings: Secondary | ICD-10-CM | POA: Diagnosis not present

## 2022-06-22 LAB — CBC WITH DIFFERENTIAL/PLATELET
Basophils Absolute: 0 10*3/uL (ref 0.0–0.1)
Basophils Relative: 0.6 % (ref 0.0–3.0)
Eosinophils Absolute: 0.2 10*3/uL (ref 0.0–0.7)
Eosinophils Relative: 4.2 % (ref 0.0–5.0)
HCT: 41.4 % (ref 36.0–46.0)
Hemoglobin: 14 g/dL (ref 12.0–15.0)
Lymphocytes Relative: 39.3 % (ref 12.0–46.0)
Lymphs Abs: 1.5 10*3/uL (ref 0.7–4.0)
MCHC: 33.9 g/dL (ref 30.0–36.0)
MCV: 88.1 fl (ref 78.0–100.0)
Monocytes Absolute: 0.4 10*3/uL (ref 0.1–1.0)
Monocytes Relative: 11.1 % (ref 3.0–12.0)
Neutro Abs: 1.7 10*3/uL (ref 1.4–7.7)
Neutrophils Relative %: 44.8 % (ref 43.0–77.0)
Platelets: 268 10*3/uL (ref 150.0–400.0)
RBC: 4.7 Mil/uL (ref 3.87–5.11)
RDW: 13.8 % (ref 11.5–15.5)
WBC: 3.9 10*3/uL — ABNORMAL LOW (ref 4.0–10.5)

## 2022-06-22 LAB — URINALYSIS, ROUTINE W REFLEX MICROSCOPIC
Bilirubin Urine: NEGATIVE
Hgb urine dipstick: NEGATIVE
Ketones, ur: NEGATIVE
Nitrite: NEGATIVE
Specific Gravity, Urine: 1.02 (ref 1.000–1.030)
Total Protein, Urine: NEGATIVE
Urine Glucose: NEGATIVE
Urobilinogen, UA: 0.2 (ref 0.0–1.0)
pH: 6.5 (ref 5.0–8.0)

## 2022-06-22 LAB — COMPREHENSIVE METABOLIC PANEL
ALT: 21 U/L (ref 0–35)
AST: 21 U/L (ref 0–37)
Albumin: 4.2 g/dL (ref 3.5–5.2)
Alkaline Phosphatase: 62 U/L (ref 39–117)
BUN: 17 mg/dL (ref 6–23)
CO2: 29 mEq/L (ref 19–32)
Calcium: 9.5 mg/dL (ref 8.4–10.5)
Chloride: 106 mEq/L (ref 96–112)
Creatinine, Ser: 0.68 mg/dL (ref 0.40–1.20)
GFR: 96.2 mL/min (ref >60.00)
Glucose, Bld: 91 mg/dL (ref 70–99)
Potassium: 4.4 mEq/L (ref 3.5–5.1)
Sodium: 143 mEq/L (ref 135–145)
Total Bilirubin: 1.1 mg/dL (ref 0.2–1.2)
Total Protein: 6.4 g/dL (ref 6.0–8.3)

## 2022-06-22 LAB — LIPID PANEL
Cholesterol: 226 mg/dL — ABNORMAL HIGH (ref 0–200)
HDL: 76.3 mg/dL (ref >39.00)
LDL Cholesterol: 139 mg/dL — ABNORMAL HIGH (ref 0–99)
NonHDL: 149.93
Total CHOL/HDL Ratio: 3
Triglycerides: 54 mg/dL (ref 0.0–149.0)
VLDL: 10.8 mg/dL (ref 0.0–40.0)

## 2022-06-22 LAB — VITAMIN D 25 HYDROXY (VIT D DEFICIENCY, FRACTURES): VITD: 32.92 ng/mL (ref 30.00–100.00)
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
## 2022-07-14 ENCOUNTER — Encounter: Payer: Self-pay | Admitting: Family Medicine

## 2022-08-06 ENCOUNTER — Encounter: Payer: Self-pay | Admitting: Family Medicine

## 2022-10-14 ENCOUNTER — Other Ambulatory Visit: Payer: Self-pay | Admitting: Obstetrics and Gynecology

## 2022-10-14 DIAGNOSIS — Z1231 Encounter for screening mammogram for malignant neoplasm of breast: Secondary | ICD-10-CM

## 2022-10-28 ENCOUNTER — Other Ambulatory Visit: Payer: Self-pay | Admitting: Obstetrics and Gynecology

## 2022-10-28 DIAGNOSIS — Z8249 Family history of ischemic heart disease and other diseases of the circulatory system: Secondary | ICD-10-CM

## 2022-11-05 ENCOUNTER — Ambulatory Visit
Admission: RE | Admit: 2022-11-05 | Discharge: 2022-11-05 | Disposition: A | Discharge status: Home / Self Care | Payer: BC Managed Care – PPO | Source: Ambulatory Visit | Attending: Obstetrics and Gynecology | Admitting: Obstetrics and Gynecology

## 2022-11-05 DIAGNOSIS — Z1231 Encounter for screening mammogram for malignant neoplasm of breast: Secondary | ICD-10-CM

## 2022-11-10 ENCOUNTER — Encounter: Payer: Self-pay | Admitting: Gastroenterology

## 2022-11-18 ENCOUNTER — Ambulatory Visit
Admission: RE | Admit: 2022-11-18 | Discharge: 2022-11-18 | Disposition: A | Discharge status: Home / Self Care | Payer: No Typology Code available for payment source | Source: Ambulatory Visit | Attending: Obstetrics and Gynecology | Admitting: Obstetrics and Gynecology

## 2022-11-18 DIAGNOSIS — Z8249 Family history of ischemic heart disease and other diseases of the circulatory system: Secondary | ICD-10-CM

## 2022-12-17 ENCOUNTER — Other Ambulatory Visit: Payer: BC Managed Care – PPO

## 2022-12-31 ENCOUNTER — Ambulatory Visit: Payer: BC Managed Care – PPO

## 2022-12-31 DIAGNOSIS — Z23 Encounter for immunization: Secondary | ICD-10-CM

## 2023-01-15 ENCOUNTER — Ambulatory Visit: Payer: BC Managed Care – PPO

## 2023-01-15 DIAGNOSIS — Z23 Encounter for immunization: Secondary | ICD-10-CM | POA: Diagnosis not present

## 2023-01-25 ENCOUNTER — Ambulatory Visit (AMBULATORY_SURGERY_CENTER): Payer: BC Managed Care – PPO

## 2023-01-25 VITALS — Ht 68.0 in | Wt 155.0 lb

## 2023-01-25 DIAGNOSIS — Z860100 Personal history of colon polyps, unspecified: Secondary | ICD-10-CM

## 2023-01-25 MED ORDER — ONDANSETRON HCL 4 MG PO TABS: 4.0000 mg | ORAL_TABLET | ORAL | 0 refills | Status: DC

## 2023-01-25 MED ORDER — NA SULFATE-K SULFATE-MG SULF 17.5-3.13-1.6 GM/177ML PO SOLN: 1.0000 | Freq: Once | ORAL | 0 refills | Status: AC

## 2023-01-25 NOTE — Progress Notes (Signed)

## 2023-02-10 ENCOUNTER — Encounter: Payer: Self-pay | Admitting: Gastroenterology

## 2023-02-15 ENCOUNTER — Encounter: Payer: Self-pay | Admitting: Gastroenterology

## 2023-02-15 ENCOUNTER — Encounter: Payer: BC Managed Care – PPO | Admitting: Gastroenterology

## 2023-02-22 ENCOUNTER — Ambulatory Visit: Payer: BC Managed Care – PPO | Admitting: Gastroenterology

## 2023-02-22 ENCOUNTER — Encounter: Payer: Self-pay | Admitting: Gastroenterology

## 2023-02-22 VITALS — BP 91/61 | HR 58 | Temp 97.7°F | Resp 14 | Ht 68.5 in | Wt 155.0 lb

## 2023-02-22 DIAGNOSIS — Z860100 Personal history of colon polyps, unspecified: Secondary | ICD-10-CM

## 2023-02-22 DIAGNOSIS — Z860101 Personal history of adenomatous and serrated colon polyps: Secondary | ICD-10-CM | POA: Diagnosis not present

## 2023-02-22 DIAGNOSIS — Z09 Encounter for follow-up examination after completed treatment for conditions other than malignant neoplasm: Secondary | ICD-10-CM | POA: Diagnosis present

## 2023-02-22 DIAGNOSIS — D123 Benign neoplasm of transverse colon: Secondary | ICD-10-CM | POA: Diagnosis not present

## 2023-02-22 DIAGNOSIS — K635 Polyp of colon: Secondary | ICD-10-CM | POA: Diagnosis not present

## 2023-02-22 MED ORDER — SODIUM CHLORIDE 0.9 % IV SOLN: 500.0000 mL | Freq: Once | INTRAVENOUS | Status: DC

## 2023-02-22 NOTE — Progress Notes (Signed)
VS completed by Autumn    Pt's states no medical or surgical changes since previsit or office visit.

## 2023-02-22 NOTE — Progress Notes (Signed)
Sedate, gd SR, tolerated procedure well, VSS, report to RN 

## 2023-02-22 NOTE — Progress Notes (Signed)
Called to room to assist during endoscopic procedure.  Patient ID and intended procedure confirmed with present staff. Received instructions for my participation in the procedure from the performing physician.  

## 2023-02-22 NOTE — Patient Instructions (Signed)

## 2023-02-22 NOTE — Op Note (Signed)
[facility] Patient Name: Briana Holden Procedure Date: 02/22/2023 8:59 AM MRN: [id number] Endoscopist: Viviann Spare P. Adela Lank , MD, [id number] Age: 57 Referring MD:  Date of Birth: Jan 17, 1966 Gender: Female Account #: [id number] Procedure:                Colonoscopy Indications:              High risk colon cancer surveillance: Personal                            history of colonic polyps - 4 polyps removed                            12/2014, 3 polyps removed 01/2018 (sessile serrated) Medicines:                Monitored Anesthesia Care Procedure:                Pre-Anesthesia Assessment:                           - Prior to the procedure, a History and Physical                            was performed, and patient medications and                            allergies were reviewed. The patient's tolerance of                            previous anesthesia was also reviewed. The risks                            and benefits of the procedure and the sedation                            options and risks were discussed with the patient.                            All questions were answered, and informed consent                            was obtained. Prior Anticoagulants: The patient has                            taken no anticoagulant or antiplatelet agents. ASA                            Grade Assessment: II - A patient with mild systemic                            disease. After reviewing the risks and benefits,                            the patient was deemed in satisfactory condition to  undergo the procedure.                           After obtaining informed consent, the colonoscope                            was passed under direct vision. Throughout the                            procedure, the patient's blood pressure, pulse, and                            oxygen saturations were monitored continuously. The                             PCF-HQ190L Colonoscope [id number] was introduced                            through the anus and advanced to the the cecum,                            identified by appendiceal orifice and ileocecal                            valve. The colonoscopy was performed without                            difficulty. The patient tolerated the procedure                            well. The quality of the bowel preparation was                            good. The ileocecal valve, appendiceal orifice, and                            rectum were photographed. Scope In: 9:10:04 AM Scope Out: 9:30:30 AM Scope Withdrawal Time: 0 hours 14 minutes 33 seconds  Total Procedure Duration: 0 hours 20 minutes 26 seconds  Findings:                 The perianal and digital rectal examinations were                            normal.                           A 4 mm polyp was found in the hepatic flexure. The                            polyp was flat. The polyp was removed with a cold                            snare. Resection and retrieval were complete.  A 2 to 3 mm polyp was found in the transverse                            colon. The polyp was flat. The polyp was removed                            with a cold snare. Resection and retrieval were                            complete.                           Internal hemorrhoids were found during retroflexion.                           The exam was otherwise without abnormality. Complications:            No immediate complications. Estimated blood loss:                            Minimal. Estimated Blood Loss:     Estimated blood loss was minimal. Impression:               - One 4 mm polyp at the hepatic flexure, removed                            with a cold snare. Resected and retrieved.                           - One 2 to 3 mm polyp in the transverse colon,                            removed with a cold snare. Resected and retrieved.                            - Internal hemorrhoids.                           - The examination was otherwise normal. Recommendation:           - Patient has a contact number available for                            emergencies. The signs and symptoms of potential                            delayed complications were discussed with the                            patient. Return to normal activities tomorrow.                            Written discharge instructions were provided to the  patient.                           - Resume previous diet.                           - Continue present medications.                           - Await pathology results. Viviann Spare P. Shamyah Stantz, MD 02/22/2023 9:34:54 AM This report has been signed electronically.
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
## 2023-02-23 ENCOUNTER — Telehealth: Payer: Self-pay

## 2023-02-23 NOTE — Telephone Encounter (Signed)
  Follow up Call-     02/22/2023    8:21 AM  Call back number  Post procedure Call Back phone  # [phone]  Permission to leave phone message Yes     Patient questions:  Do you have a fever, pain , or abdominal swelling? No. Pain Score  0 *  Have you tolerated food without any problems? Yes.    Have you been able to return to your normal activities? Yes.    Do you have any questions about your discharge instructions: Diet   No. Medications  No. Follow up visit  No.  Do you have questions or concerns about your Care? No.  Actions: * If pain score is 4 or above: No action needed, pain <4.

## 2023-02-25 LAB — SURGICAL PATHOLOGY

## 2023-03-15 ENCOUNTER — Telehealth: Payer: Self-pay | Admitting: Gastroenterology

## 2023-03-15 NOTE — Telephone Encounter (Signed)
PT is calling regarding the coding of her procedure. She is not quite understanding why it is high risk and would like it thoroughly explained so she can understand the billing. Please advise.

## 2023-03-16 NOTE — Telephone Encounter (Signed)
PT is calling to get an update on the coding of her procedure. Please advise.

## 2023-04-23 ENCOUNTER — Telehealth: Payer: Self-pay | Admitting: Family Medicine

## 2023-04-23 NOTE — Telephone Encounter (Signed)
 error

## 2023-06-17 ENCOUNTER — Other Ambulatory Visit: Payer: Self-pay | Admitting: Family Medicine

## 2023-06-17 DIAGNOSIS — Z8781 Personal history of (healed) traumatic fracture: Secondary | ICD-10-CM

## 2023-06-17 DIAGNOSIS — Z780 Asymptomatic menopausal state: Secondary | ICD-10-CM

## 2023-06-24 ENCOUNTER — Encounter: Payer: BC Managed Care – PPO | Admitting: Family Medicine

## 2023-07-06 ENCOUNTER — Ambulatory Visit (INDEPENDENT_AMBULATORY_CARE_PROVIDER_SITE_OTHER): Payer: BC Managed Care – PPO | Admitting: Family Medicine

## 2023-07-06 ENCOUNTER — Encounter: Payer: Self-pay | Admitting: Family Medicine

## 2023-07-06 VITALS — BP 106/78 | HR 69 | Temp 97.9°F | Ht 68.0 in | Wt 165.4 lb

## 2023-07-06 DIAGNOSIS — Z131 Encounter for screening for diabetes mellitus: Secondary | ICD-10-CM | POA: Diagnosis not present

## 2023-07-06 DIAGNOSIS — Z0000 Encounter for general adult medical examination without abnormal findings: Secondary | ICD-10-CM | POA: Diagnosis not present

## 2023-07-06 DIAGNOSIS — Z13220 Encounter for screening for lipoid disorders: Secondary | ICD-10-CM | POA: Diagnosis not present

## 2023-07-06 LAB — LIPID PANEL
Cholesterol: 244 mg/dL — ABNORMAL HIGH (ref 0–200)
HDL: 71.2 mg/dL (ref >39.00)
LDL Cholesterol: 158 mg/dL — ABNORMAL HIGH (ref 0–99)
NonHDL: 173.23
Total CHOL/HDL Ratio: 3
Triglycerides: 77 mg/dL (ref 0.0–149.0)
VLDL: 15.4 mg/dL (ref 0.0–40.0)

## 2023-07-06 LAB — COMPREHENSIVE METABOLIC PANEL
ALT: 18 U/L (ref 0–35)
AST: 18 U/L (ref 0–37)
Albumin: 4.6 g/dL (ref 3.5–5.2)
Alkaline Phosphatase: 61 U/L (ref 39–117)
BUN: 14 mg/dL (ref 6–23)
CO2: 30 meq/L (ref 19–32)
Calcium: 9.4 mg/dL (ref 8.4–10.5)
Chloride: 103 meq/L (ref 96–112)
Creatinine, Ser: 0.67 mg/dL (ref 0.40–1.20)
GFR: 95.85 mL/min (ref >60.00)
Glucose, Bld: 98 mg/dL (ref 70–99)
Potassium: 4.1 meq/L (ref 3.5–5.1)
Sodium: 140 meq/L (ref 135–145)
Total Bilirubin: 1.1 mg/dL (ref 0.2–1.2)
Total Protein: 7.2 g/dL (ref 6.0–8.3)

## 2023-07-06 LAB — CBC WITH DIFFERENTIAL/PLATELET
Basophils Absolute: 0 10*3/uL (ref 0.0–0.1)
Basophils Relative: 0.5 % (ref 0.0–3.0)
Eosinophils Absolute: 0.1 10*3/uL (ref 0.0–0.7)
Eosinophils Relative: 3.2 % (ref 0.0–5.0)
HCT: 42.4 % (ref 36.0–46.0)
Hemoglobin: 14.3 g/dL (ref 12.0–15.0)
Lymphocytes Relative: 38 % (ref 12.0–46.0)
Lymphs Abs: 1.4 10*3/uL (ref 0.7–4.0)
MCHC: 33.8 g/dL (ref 30.0–36.0)
MCV: 89.6 fl (ref 78.0–100.0)
Monocytes Absolute: 0.4 10*3/uL (ref 0.1–1.0)
Monocytes Relative: 10.2 % (ref 3.0–12.0)
Neutro Abs: 1.7 10*3/uL (ref 1.4–7.7)
Neutrophils Relative %: 48.1 % (ref 43.0–77.0)
Platelets: 297 10*3/uL (ref 150.0–400.0)
RBC: 4.73 Mil/uL (ref 3.87–5.11)
RDW: 13.9 % (ref 11.5–15.5)
WBC: 3.6 10*3/uL — ABNORMAL LOW (ref 4.0–10.5)

## 2023-07-06 LAB — URINALYSIS, ROUTINE W REFLEX MICROSCOPIC
Bilirubin Urine: NEGATIVE
Hgb urine dipstick: NEGATIVE
Nitrite: NEGATIVE
Specific Gravity, Urine: 1.02 (ref 1.000–1.030)
Total Protein, Urine: NEGATIVE
Urine Glucose: NEGATIVE
Urobilinogen, UA: 0.2 (ref 0.0–1.0)
pH: 6 (ref 5.0–8.0)

## 2023-07-06 LAB — HEMOGLOBIN A1C: Hgb A1c MFr Bld: 5.9 % (ref 4.6–6.5)

## 2023-07-06 NOTE — Progress Notes (Signed)
 Established Patient Office Visit   Subjective:  Patient ID: Briana Holden, female    DOB: 04-08-1966  Age: 58 y.o. MRN: [id number]  Chief Complaint  Patient presents with   Annual Exam    Cpe. Pt is fasting.    HPI Encounter Diagnoses  Name Primary?   Healthcare maintenance Yes   Screening for cholesterol level    Screening for diabetes mellitus    Here for physical.  Doing well.  She is exercising 300 minutes a week and has regular dental care.  Has scheduled follow-up with GYN for Pap and pelvic exams.  Up-to-date on health maintenance.  Lives at home with her husband.  Daughter recently married back in October.   Review of Systems  Constitutional: Negative.   HENT: Negative.    Eyes:  Negative for blurred vision, discharge and redness.  Respiratory: Negative.    Cardiovascular: Negative.   Gastrointestinal:  Negative for abdominal pain.  Genitourinary: Negative.   Musculoskeletal: Negative.  Negative for myalgias.  Skin:  Negative for rash.  Neurological:  Negative for tingling, loss of consciousness and weakness.  Endo/Heme/Allergies:  Negative for polydipsia.      07/06/2023    8:51 AM 06/22/2022    8:08 AM 06/19/2021    8:29 AM  Depression screen PHQ 2/9  Decreased Interest 0 0 0  Down, Depressed, Hopeless 0 0 0  PHQ - 2 Score 0 0 0  Altered sleeping 0  0  Tired, decreased energy 0  0  Change in appetite 1  0  Feeling bad or failure about yourself  0  0  Trouble concentrating 0  0  Moving slowly or fidgety/restless 0  0  Suicidal thoughts 0  0  PHQ-9 Score 1  0  Difficult doing work/chores   Not difficult at all      Current Outpatient Medications:    Cholecalciferol (VITAMIN D3) 2000 units capsule, Take 2,000 Units by mouth daily., Disp: , Rfl:    Multiple Minerals-Vitamins (CITRACAL MAXIMUM PLUS PO), , Disp: , Rfl:    Multiple Vitamins-Minerals (MULTIVITAMIN ADULT PO), Take 1 tablet by mouth daily., Disp: , Rfl:    Psyllium (FIBER) 28.3 % POWD,  fiber, Disp: , Rfl:    triamcinolone cream (KENALOG) 0.1 %, SMARTSIG:1 Application Topical 2-3 Times Daily, Disp: , Rfl:    ondansetron (ZOFRAN) 4 MG tablet, Take 1 tablet (4 mg total) by mouth as directed. Take one Zofran 4 mg tablet 30-60 minutes before each colonoscopy prep dose (Patient not taking: Reported on 07/06/2023), Disp: 2 tablet, Rfl: 0   Objective:     BP 106/78 (BP Location: Right Arm, Patient Position: Sitting, Cuff Size: Normal)   Pulse 69   Temp 97.9 F (36.6 C)   Ht 5\' 8"  (1.727 m)   Wt 165 lb 6.4 oz (75 kg)   LMP 08/18/2016   SpO2 96%   BMI 25.15 kg/m    Physical Exam Constitutional:      General: She is not in acute distress.    Appearance: Normal appearance. She is not ill-appearing, toxic-appearing or diaphoretic.  HENT:     Head: Normocephalic and atraumatic.     Right Ear: Tympanic membrane, ear canal and external ear normal.     Left Ear: Tympanic membrane, ear canal and external ear normal.     Mouth/Throat:     Mouth: Mucous membranes are moist.     Pharynx: Oropharynx is clear. No oropharyngeal exudate or posterior oropharyngeal erythema.  Eyes:  General: No scleral icterus.       Right eye: No discharge.        Left eye: No discharge.     Extraocular Movements: Extraocular movements intact.     Conjunctiva/sclera: Conjunctivae normal.     Pupils: Pupils are equal, round, and reactive to light.  Cardiovascular:     Rate and Rhythm: Normal rate and regular rhythm.  Pulmonary:     Effort: Pulmonary effort is normal. No respiratory distress.     Breath sounds: Normal breath sounds. No wheezing, rhonchi or rales.  Abdominal:     General: Bowel sounds are normal.  Musculoskeletal:     Cervical back: No rigidity or tenderness.     Right lower leg: No edema.     Left lower leg: No edema.  Lymphadenopathy:     Cervical: No cervical adenopathy.  Skin:    General: Skin is warm and dry.  Neurological:     Mental Status: She is alert and  oriented to person, place, and time.  Psychiatric:        Mood and Affect: Mood normal.        Behavior: Behavior normal.      No results found for any visits on 07/06/23.    The 10-year ASCVD risk score (Arnett DK, et al., 2019) is: 1.8%    Assessment & Plan:   Healthcare maintenance -     CBC with Differential/Platelet -     Urinalysis, Routine w reflex microscopic  Screening for cholesterol level -     Comprehensive metabolic panel -     Lipid panel  Screening for diabetes mellitus -     Comprehensive metabolic panel -     Hemoglobin A1c    Return in about 1 year (around 07/05/2024), or if symptoms worsen or fail to improve.  Continue regular exercise and weight loss efforts.  Information was given on health maintenance and disease prevention.  Mliss Sax, MD

## 2023-11-18 ENCOUNTER — Other Ambulatory Visit: Payer: Self-pay | Admitting: Obstetrics and Gynecology

## 2023-11-18 DIAGNOSIS — Z1231 Encounter for screening mammogram for malignant neoplasm of breast: Secondary | ICD-10-CM

## 2023-12-27 ENCOUNTER — Ambulatory Visit
Admission: RE | Admit: 2023-12-27 | Discharge: 2023-12-27 | Disposition: A | Discharge status: Home / Self Care | Source: Ambulatory Visit | Attending: Obstetrics and Gynecology | Admitting: Obstetrics and Gynecology

## 2023-12-27 DIAGNOSIS — Z1231 Encounter for screening mammogram for malignant neoplasm of breast: Secondary | ICD-10-CM

## 2024-03-23 ENCOUNTER — Encounter: Payer: Self-pay | Admitting: Family Medicine

## 2024-03-27 ENCOUNTER — Ambulatory Visit: Admitting: Family Medicine

## 2024-03-27 ENCOUNTER — Encounter: Payer: Self-pay | Admitting: Emergency Medicine

## 2024-03-27 ENCOUNTER — Ambulatory Visit: Admitting: Emergency Medicine

## 2024-03-27 VITALS — BP 108/66 | HR 75 | Temp 98.4°F | Resp 18 | Ht 68.0 in | Wt 168.0 lb

## 2024-03-27 DIAGNOSIS — B379 Candidiasis, unspecified: Secondary | ICD-10-CM

## 2024-03-27 DIAGNOSIS — N390 Urinary tract infection, site not specified: Secondary | ICD-10-CM

## 2024-03-27 LAB — POCT URINALYSIS DIPSTICK
Bilirubin, UA: NEGATIVE
Blood, UA: POSITIVE
Glucose, UA: NEGATIVE
Ketones, UA: NEGATIVE
Nitrite, UA: NEGATIVE
Protein, UA: NEGATIVE
Spec Grav, UA: 1.025 (ref 1.010–1.025)
Urobilinogen, UA: 0.2 U/dL
pH, UA: 5 (ref 5.0–8.0)

## 2024-03-27 MED ORDER — FLUCONAZOLE 150 MG PO TABS: 150.0000 mg | ORAL_TABLET | Freq: Once | ORAL | 0 refills | Status: AC

## 2024-03-27 MED ORDER — NITROFURANTOIN MONOHYD MACRO 100 MG PO CAPS: 100.0000 mg | ORAL_CAPSULE | Freq: Two times a day (BID) | ORAL | 0 refills | Status: AC

## 2024-03-27 MED ORDER — PHENAZOPYRIDINE HCL 200 MG PO TABS: 200.0000 mg | ORAL_TABLET | Freq: Three times a day (TID) | ORAL | 0 refills | Status: AC | PRN

## 2024-03-27 NOTE — Progress Notes (Signed)
 Assessment & Plan:   Assessment & Plan Acute UTI Results for orders placed or performed in visit on 03/27/24  POCT urinalysis dipstick   Collection Time: 03/27/24  8:34 AM  Result Value Ref Range   Color, UA     Clarity, UA     Glucose, UA Negative Negative   Bilirubin, UA neg    Ketones, UA neg    Spec Grav, UA 1.025 1.010 - 1.025   Blood, UA positive    pH, UA 5.0 5.0 - 8.0   Protein, UA Negative Negative   Urobilinogen, UA 0.2 0.2 or 1.0 E.U./dL   Nitrite, UA neg    Leukocytes, UA Moderate (2+) (A) Negative   Appearance     Odor     U/a consistent with UTI No sign of pyelo Rx as below Orders:   POCT urinalysis dipstick   Urine Culture   nitrofurantoin , macrocrystal-monohydrate, (MACROBID ) 100 MG capsule; Take 1 capsule (100 mg total) by mouth 2 (two) times daily for 7 days.  Antibiotic-induced yeast infection Has developed yeast infection with UTI in the past. Rx to have on hand.  Orders:   fluconazole  (DIFLUCAN ) 150 MG tablet; Take 1 tablet (150 mg total) by mouth once for 1 dose.   Patient Instructions  Take the antibiotics as directed.   Use the pyridium  (over the counter is AZO for pain/phenazopyridine ) to help with symptoms.   Avoid intake of bladder irritants, if you do consume them- these include alcohol, caffeine, or carbonated beverages like soda or seltzer water  You should be seen again if symptoms persist after treatment, sooner if you develop worsening symptoms to include fever, back pain, abdominal pain, nausea, or vomiting.     Corean Geralds, MSPAS, PA-C    Subjective:   Chief Complaint  Patient presents with   Urinary Frequency    Inconsistent urinary frequency x 4 days. Denies dysuria, vaginal itching, vaginal odor.     HPI:   Discussed the use of AI scribe software for clinical note transcription with the patient, who gave verbal consent to proceed.  History of Present Illness Briana Holden is a 58 year old female who  presents with urinary urgency and frequency.  She has had increasing urinary urgency and frequency since Thursday, with urge to void even after urinating. Symptoms were previously intermittent once or twice a week, usually after workouts while remaining in sweaty clothes, but since Thursday the urgency has been persistent for several hours and now occurs daily. She notes cloudy urine. She denies fever, vomiting, vaginal symptoms, pain when wiping, or visible blood in the urine. She increased water intake since Thursday with minimal relief.     ROS: Negative unless specifically indicated above in HPI.   Relevant past medical history reviewed and updated as indicated.   Allergies and medications reviewed and updated.  Current Medications[1]  Allergies[2]  Social History[3]   Objective:   Vitals:   03/27/24 0823  BP: 108/66  Pulse: 75  Temp: 98.4 F (36.9 C)  Resp: 18  Height: 5' 8 (1.727 m)  Weight: 168 lb (76.2 kg)  SpO2: 96%  BMI (Calculated): 25.55     Appears well, INAD Sclera anicteric Oral mucosa moist Heart RRR Normal resp effort and excursion. Bases clear.  Abd soft, NTND. No CVAT     [1]  Current Outpatient Medications:    Cholecalciferol (VITAMIN D3) 2000 units capsule, Take 2,000 Units by mouth daily., Disp: , Rfl:    Multiple  Vitamins-Minerals (MULTIVITAMIN ADULT PO), Take 1 tablet by mouth daily., Disp: , Rfl:    Psyllium (FIBER) 28.3 % POWD, fiber, Disp: , Rfl:    triamcinolone cream (KENALOG) 0.1 %, SMARTSIG:1 Application Topical 2-3 Times Daily, Disp: , Rfl:    Multiple Minerals-Vitamins (CITRACAL MAXIMUM PLUS PO), , Disp: , Rfl:  [2]  Allergies Allergen Reactions   No Known Allergies   [3]  Social History Tobacco Use   Smoking status: Never   Smokeless tobacco: Never  Vaping Use   Vaping status: Never Used  Substance Use Topics   Alcohol use: Yes    Alcohol/week: 4.0 standard drinks of alcohol    Types: 4 Standard drinks or equivalent  per week    Comment: 3drink per week   Drug use: No

## 2024-03-27 NOTE — Patient Instructions (Signed)
 Take the antibiotics as directed.   Use the pyridium  (over the counter is AZO for pain/phenazopyridine ) to help with symptoms.   Avoid intake of bladder irritants, if you do consume them- these include alcohol, caffeine, or carbonated beverages like soda or seltzer water  You should be seen again if symptoms persist after treatment, sooner if you develop worsening symptoms to include fever, back pain, abdominal pain, nausea, or vomiting.

## 2024-03-29 ENCOUNTER — Ambulatory Visit: Payer: Self-pay | Admitting: Emergency Medicine

## 2024-03-29 DIAGNOSIS — N390 Urinary tract infection, site not specified: Secondary | ICD-10-CM

## 2024-03-29 LAB — URINE CULTURE
MICRO NUMBER:: 17356123
SPECIMEN QUALITY:: ADEQUATE

## 2024-04-03 ENCOUNTER — Ambulatory Visit: Admitting: Emergency Medicine

## 2024-04-03 MED ORDER — SULFAMETHOXAZOLE-TRIMETHOPRIM 800-160 MG PO TABS: 1.0000 | ORAL_TABLET | Freq: Two times a day (BID) | ORAL | 0 refills | Status: AC

## 2024-04-03 NOTE — Telephone Encounter (Signed)
 Spoke with patient Improved but not resolved despite 7d macrobid  Culture shows sensitivity to bactrim , will rx this and recommend repeat culture after completion if still not improved.  Recheck sooner if new/worsening symptoms

## 2024-04-03 NOTE — Telephone Encounter (Signed)
 Patient is scheduled to come in today

## 2024-07-07 ENCOUNTER — Encounter: Admitting: Family Medicine
